# Patient Record
Sex: Female | Born: 1970 | Race: White | Hispanic: No | Marital: Married | Smoking: Former smoker
Health system: Southern US, Academic
[De-identification: ages and names within clinical notes are randomized; demographics above are authoritative.]

## PROBLEM LIST (undated history)

## (undated) ENCOUNTER — Emergency Department (HOSPITAL_COMMUNITY): Admission: EM

## (undated) DIAGNOSIS — I82409 Acute embolism and thrombosis of unspecified deep veins of unspecified lower extremity: Secondary | ICD-10-CM

## (undated) DIAGNOSIS — F32A Depression, unspecified: Secondary | ICD-10-CM

## (undated) HISTORY — PX: HX TUBAL LIGATION: SHX77

## (undated) HISTORY — PX: HX TONSILLECTOMY: SHX27

---

## 2020-03-15 IMAGING — MR MRI LUMBAR SPINE WITHOUT CONTRAST
6 series · 43 of 48 positions shown · IV contrast (gadolinium)
Comparison: None available.

EXAM:  MRI LUMBAR SPINE WITHOUT CONTRAST
INDICATION: Lower back pain with left lower extremity radiculopathy.
TECHNIQUE: Multiplanar multisequential MRI of the lumbar spine was performed without gadolinium contrast.

[Series 10: T2 · sagittal · 4.0mm · 0.94mm/px · 5 of 13 slices shown (1 of 3)]
[im 1/13]
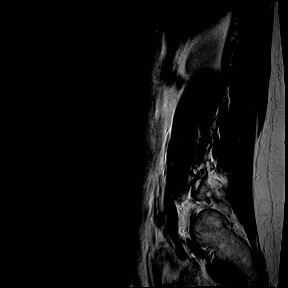
[im 4/13]
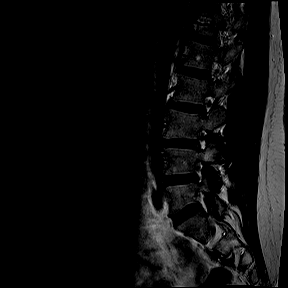
[im 7/13]
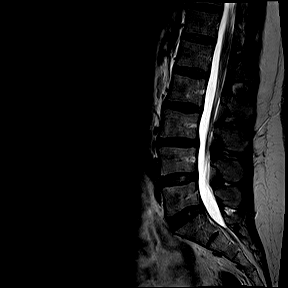
[im 10/13]
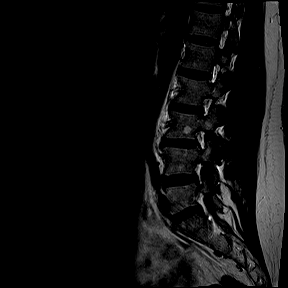
[im 13/13]
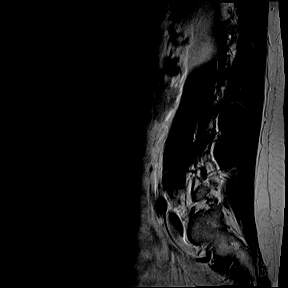

[Series 11: T1 · sagittal · 4.0mm · 0.94mm/px · 6 of 13 slices shown (1 of 2)]
[im 1/13]
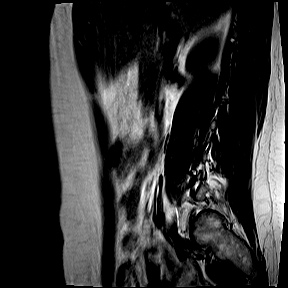
[im 3/13]
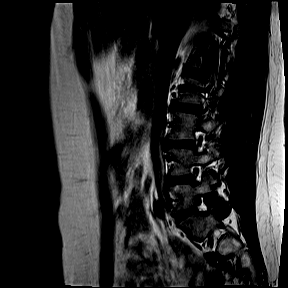
[im 5/13]
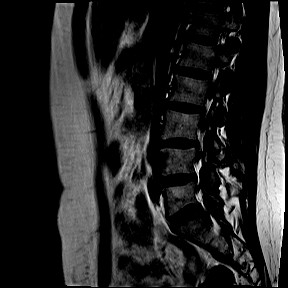
[im 8/13]
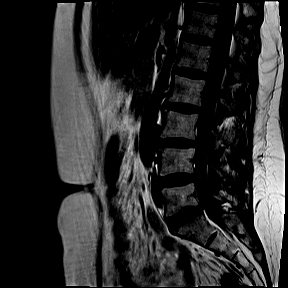
[im 10/13]
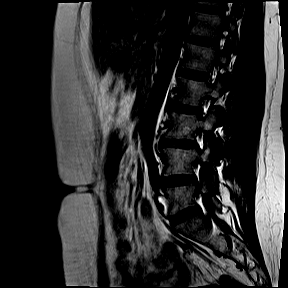
[im 13/13]
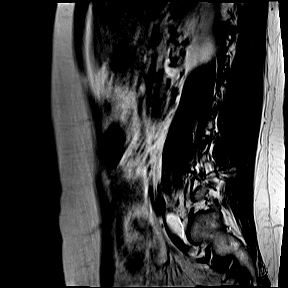

[Series 12: STIR · sagittal · 4.0mm · 1.05mm/px · 4 of 13 slices shown]
[im 1/13]
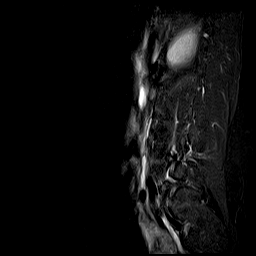
[im 3/13]
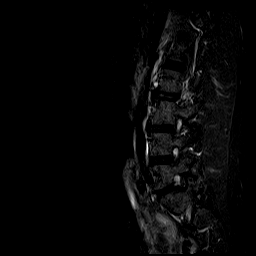
[im 5/13]
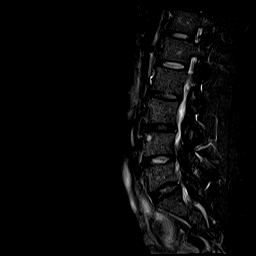
[im 8/13]
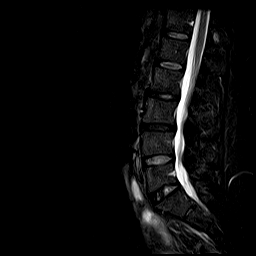

[Series 13: T2 · coronal · 4.0mm · 0.90mm/px · 9 of 20 slices shown (2 of 3)]
[im 1/20]
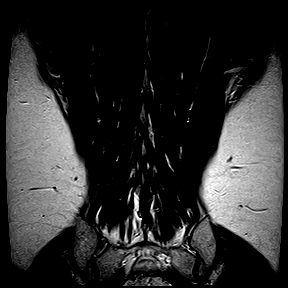
[im 3/20]
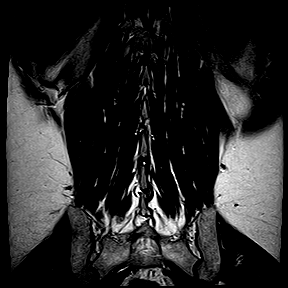
[im 5/20]
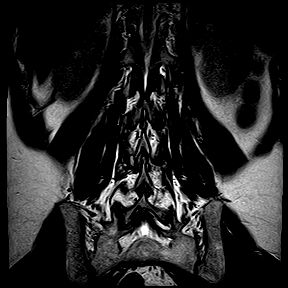
[im 8/20]
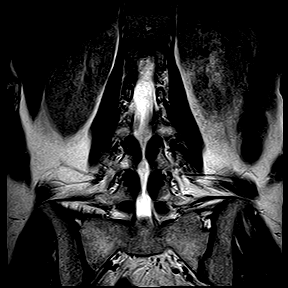
[im 10/20]
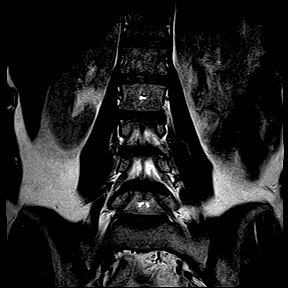
[im 12/20]
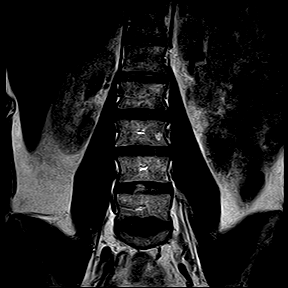
[im 15/20]
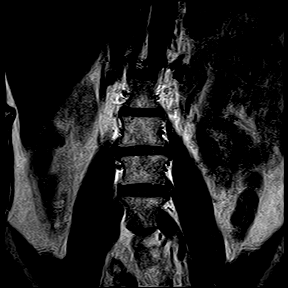
[im 17/20]
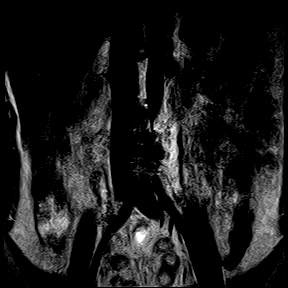
[im 20/20]
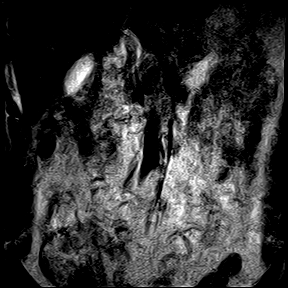

[Series 14: T2 · axial · 4.0mm · 0.47mm/px · z∈[-100,+86]mm · 11 of 23 slices shown (3 of 3)]
[im 1/23]
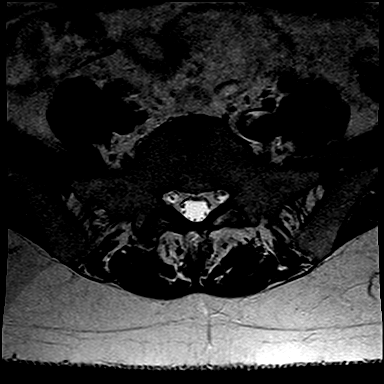
[im 3/23]
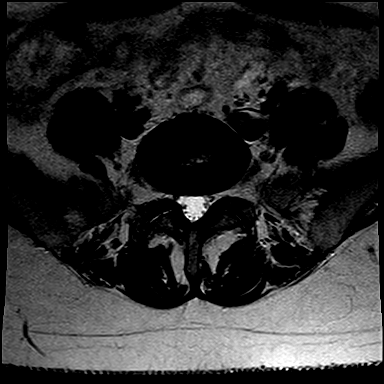
[im 5/23]
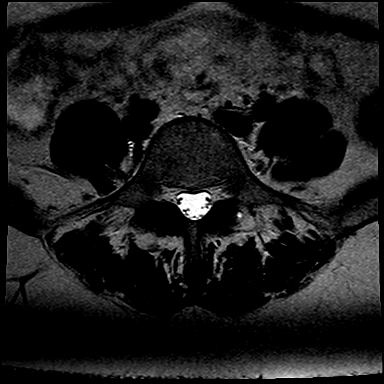
[im 7/23]
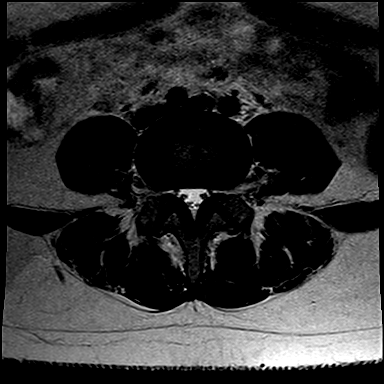
[im 9/23]
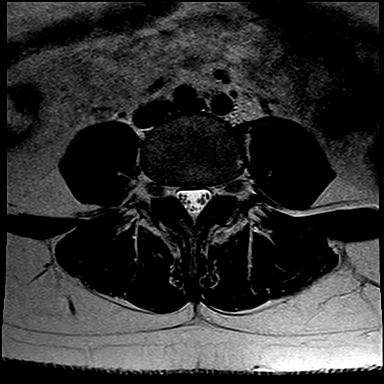
[im 12/23]
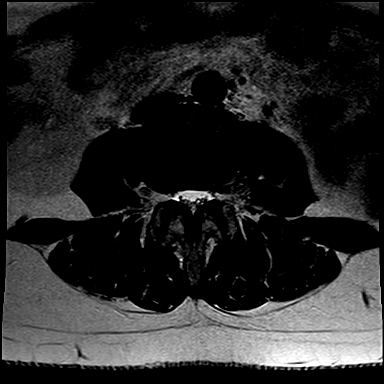
[im 14/23]
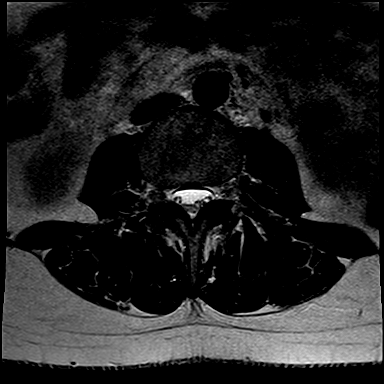
[im 16/23]
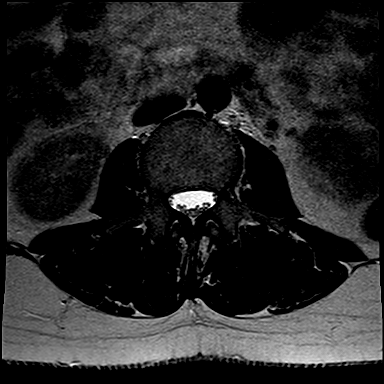
[im 18/23]
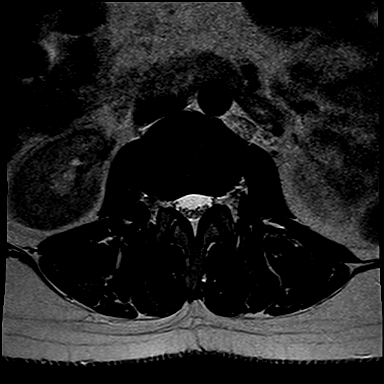
[im 20/23]
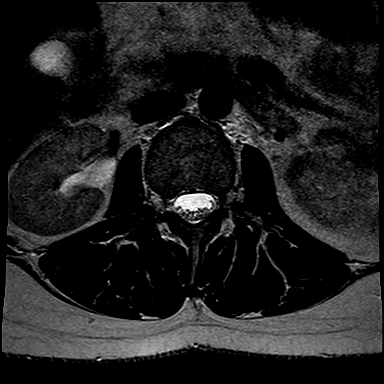
[im 23/23]
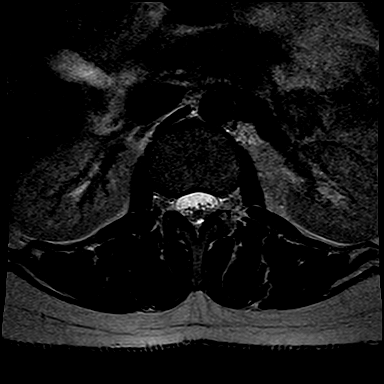

[Series 17: T1 · axial · 4.0mm · 0.47mm/px · z∈[-100,+86]mm · 8 of 23 slices shown (2 of 2)]
[im 1/23]
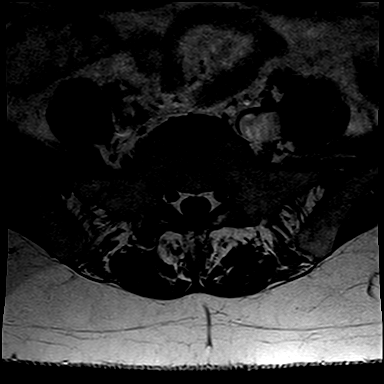
[im 5/23]
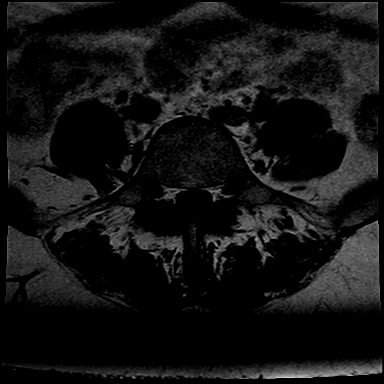
[im 7/23]
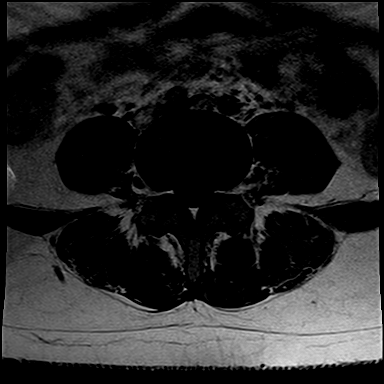
[im 9/23]
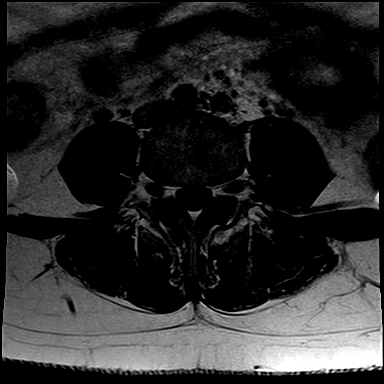
[im 14/23]
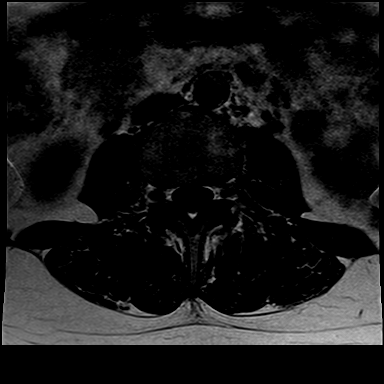
[im 16/23]
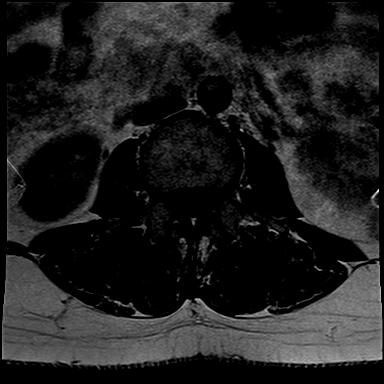
[im 18/23]
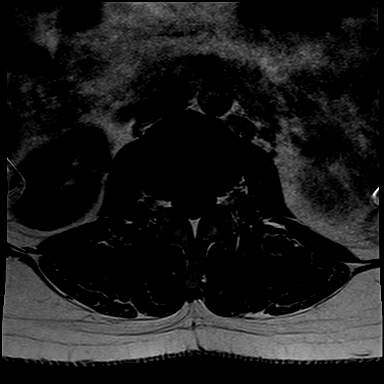
[im 23/23]
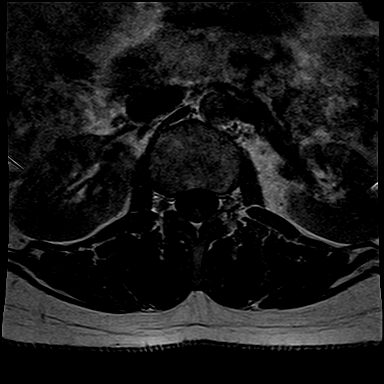

[43 of 48 positions shown; findings below may reference images not displayed]

FINDINGS: Bone marrow signal intensity is normal. There is no acute fracture or subluxation. Distal spinal cord is normal in signal intensity and terminates normally at L1 vertebral body level. Spinal canal is congenitally narrow.

L1-2 level is unremarkable.

At L2-3 level, there is mild-to-moderate right and mild left neural foraminal stenosis from facet arthropathy and bulging annulus.

At L3-4 level, there is minimal anterolisthesis of L3 on L4 vertebral body. There is a minimal bulging annulus, minimally effacing the ventral thecal sac. There is mild left and moderate right neural foraminal stenosis from facet arthropathy and bulging annulus.

At L4-5 level, there is mild-to-moderate right and mild left neural foraminal stenosis from facet arthropathy and bulging annulus.

L5-S1 level and paraspinal soft tissues are unremarkable.
IMPRESSION: Minimal anterolisthesis of L3 on L4 vertebral body. 

No significant disc herniation or spinal stenosis at any level. 

Multilevel neural foraminal stenosis as detailed above.

## 2023-02-23 ENCOUNTER — Emergency Department (HOSPITAL_BASED_OUTPATIENT_CLINIC_OR_DEPARTMENT_OTHER)

## 2023-02-23 ENCOUNTER — Other Ambulatory Visit: Payer: Self-pay

## 2023-02-23 ENCOUNTER — Encounter (HOSPITAL_BASED_OUTPATIENT_CLINIC_OR_DEPARTMENT_OTHER): Payer: Self-pay

## 2023-02-23 ENCOUNTER — Emergency Department
Admission: EM | Admit: 2023-02-23 | Discharge: 2023-02-23 | Disposition: A | Discharge status: Home / Self Care | Attending: Emergency Medicine | Admitting: Emergency Medicine

## 2023-02-23 DIAGNOSIS — R9431 Abnormal electrocardiogram [ECG] [EKG]: Secondary | ICD-10-CM | POA: Insufficient documentation

## 2023-02-23 DIAGNOSIS — W1839XA Other fall on same level, initial encounter: Secondary | ICD-10-CM | POA: Insufficient documentation

## 2023-02-23 DIAGNOSIS — R55 Syncope and collapse: Secondary | ICD-10-CM | POA: Insufficient documentation

## 2023-02-23 DIAGNOSIS — W1830XA Fall on same level, unspecified, initial encounter: Secondary | ICD-10-CM

## 2023-02-23 DIAGNOSIS — S060XAA Concussion with loss of consciousness status unknown, initial encounter: Secondary | ICD-10-CM | POA: Insufficient documentation

## 2023-02-23 DIAGNOSIS — M47812 Spondylosis without myelopathy or radiculopathy, cervical region: Secondary | ICD-10-CM | POA: Insufficient documentation

## 2023-02-23 LAB — COMPREHENSIVE METABOLIC PANEL, NON-FASTING
ALBUMIN/GLOBULIN RATIO: 1 (ref 0.8–1.4)
ALBUMIN: 3.6 g/dL (ref 3.4–5.0)
ALKALINE PHOSPHATASE: 108 U/L (ref 46–116)
ALT (SGPT): 32 U/L (ref <=78)
ANION GAP: 9 mmol/L (ref 4–13)
AST (SGOT): 17 U/L (ref 15–37)
BILIRUBIN TOTAL: 0.4 mg/dL (ref 0.2–1.0)
BUN/CREA RATIO: 13
BUN: 11 mg/dL (ref 7–18)
CALCIUM, CORRECTED: 9 mg/dL
CALCIUM: 8.7 mg/dL (ref 8.5–10.1)
CHLORIDE: 106 mmol/L (ref 98–107)
CO2 TOTAL: 25 mmol/L (ref 21–32)
CREATININE: 0.82 mg/dL (ref 0.55–1.02)
ESTIMATED GFR: 85 mL/min/{1.73_m2} (ref >59)
GLOBULIN: 3.6
GLUCOSE: 122 mg/dL — ABNORMAL HIGH (ref 74–106)
OSMOLALITY, CALCULATED: 280 mOsm/kg (ref 270–290)
POTASSIUM: 3.4 mmol/L — ABNORMAL LOW (ref 3.5–5.1)
PROTEIN TOTAL: 7.2 g/dL (ref 6.4–8.2)
SODIUM: 140 mmol/L (ref 136–145)

## 2023-02-23 LAB — CBC WITH DIFF
BASOPHIL #: 0.02 10*3/uL (ref 0.00–0.30)
BASOPHIL %: 0 % (ref 0–3)
EOSINOPHIL #: 0.12 10*3/uL (ref 0.00–0.80)
EOSINOPHIL %: 1 % (ref 0–7)
HCT: 40.6 % (ref 37.0–47.0)
HGB: 13.7 g/dL (ref 12.5–16.0)
LYMPHOCYTE #: 2.69 10*3/uL (ref 1.10–5.00)
LYMPHOCYTE %: 32 % (ref 25–45)
MCH: 28.9 pg (ref 27.0–32.0)
MCHC: 33.8 g/dL (ref 32.0–36.0)
MCV: 85.5 fL (ref 78.0–99.0)
MONOCYTE #: 0.41 10*3/uL (ref 0.00–1.30)
MONOCYTE %: 5 % (ref 0–12)
MPV: 7 fL — ABNORMAL LOW (ref 7.4–10.4)
NEUTROPHIL #: 5.07 10*3/uL (ref 1.80–8.40)
NEUTROPHIL %: 61 % (ref 40–76)
PLATELETS: 301 10*3/uL (ref 140–440)
RBC: 4.75 10*6/uL (ref 4.20–5.40)
RDW: 14.6 % (ref 11.6–14.8)
WBC: 8.3 10*3/uL (ref 4.0–10.5)

## 2023-02-23 LAB — TROPONIN-I
TROPONIN I: 3 ng/L (ref <15)
TROPONIN I: 3 ng/L (ref <15)

## 2023-02-23 LAB — BLUE TOP TUBE

## 2023-02-23 LAB — GOLD TOP TUBE

## 2023-02-23 LAB — GRAY TOP TUBE

## 2023-02-23 MED ORDER — HYDROCODONE 5 MG-ACETAMINOPHEN 325 MG TABLET
1.0000 | ORAL_TABLET | ORAL | Status: AC
Start: 2023-02-23 — End: 2023-02-23
  Administered 2023-02-23: 1 via ORAL

## 2023-02-23 MED ORDER — ASPIRIN 81 MG CHEWABLE TABLET
324.0000 mg | CHEWABLE_TABLET | ORAL | Status: AC
Start: 2023-02-23 — End: 2023-02-23
  Administered 2023-02-23: 324 mg via ORAL

## 2023-02-23 MED ORDER — LIDOCAINE 4 % TOPICAL PATCH
1.0000 | MEDICATED_PATCH | CUTANEOUS | Status: DC
Start: 2023-02-23 — End: 2023-02-23
  Administered 2023-02-23: 1 via TRANSDERMAL

## 2023-02-23 MED ORDER — LIDOCAINE 4 % TOPICAL PATCH
MEDICATED_PATCH | CUTANEOUS | Status: AC
Start: 2023-02-23 — End: 2023-02-23
  Filled 2023-02-23: qty 1

## 2023-02-23 MED ORDER — HYDROCODONE 5 MG-ACETAMINOPHEN 325 MG TABLET
ORAL_TABLET | ORAL | Status: AC
Start: 2023-02-23 — End: 2023-02-23
  Filled 2023-02-23: qty 1

## 2023-02-23 MED ORDER — ASPIRIN 81 MG CHEWABLE TABLET
CHEWABLE_TABLET | ORAL | Status: AC
Start: 2023-02-23 — End: 2023-02-23
  Filled 2023-02-23: qty 4

## 2023-02-23 NOTE — ED Nurses Note (Signed)
Patient discharged home with family.  AVS reviewed with patient/care giver.  A written copy of the AVS and discharge instructions was given to the patient/care giver.  Questions sufficiently answered as needed.  Patient/care giver encouraged to follow up with PCP as indicated.  In the event of an emergency, patient/care giver instructed to call 911 or go to the nearest emergency room.

## 2023-02-23 NOTE — ED Nurses Note (Signed)
Orthostatic VS  Right Arm    Lying BP 114/74 HR 75  Standing BP 116/80 HR 67

## 2023-02-23 NOTE — Discharge Instructions (Addendum)
Your evaluation did not reveal critical condition requiring hospitalization.  I would like you to increase your hydration.  You likely did sustain a concussion.  It is important that you avoid a 2nd head injury.  Cut down on your screen time.  Back down on activities as needed.  Follow with primary care ideally within the next 2-3 days.  You may benefit from an evaluation from Cardiology which can be arranged through primary care.  Return to emergency department if you have multiple episodes of passing out chest pain shortness of breath palpitations or any other concerning symptoms.  Thank you for visiting [facility]

## 2023-02-23 NOTE — ED Triage Notes (Signed)
EMS reports called around 0043 for pt falling off of toilet and hitting her head on metal trashcan . Pt reports stood up from toilet and then doesn't remember anything . Pt on back board and C spine immobilized on arrival to ED

## 2023-02-23 NOTE — ED Provider Notes (Signed)
[facility], [facility] Emergency Department  ED Primary Provider Note  HPI:  Terri Bautista is a 52 y.o. female     Patient states that she felt dizzy upon standing to go the bathroom.  She fell backwards and hit her head.  She did pass out.  Complains of pain in the back of her head neck and upper back.  She is mildly nauseous was not vomited.  Denies abdominal pain chest pain shortness of breath palpitations.  She denies any significant past medical history.  No history of acute coronary syndrome VTE.  COVID vaccinated.  Full code.  ROS review and negative aside from stated in HPI.    Physical Exam:  ED Triage Vitals [02/23/23 0144]   BP (Non-Invasive) 123/80   Heart Rate 67   Respiratory Rate 18   Temperature 36.7 C (98.1 F)   SpO2 97 %   Weight 74.8 kg (165 lb)   Height 1.727 m (5\' 8" )     No acute distress.  Patient awake alert oriented x3.  Mood is appropriate.  GCS 15 head is atraumatic.  No cephalohematoma or hemotympanum.  Pupils 3 mm equal round reactive.  Extraocular movements are intact.  Oropharynx is clear.  Mucous membranes moist.  Trachea midline.  Neck is supple.  She does have midline neck tenderness.  Tender upper thoracic back as well.  No bony step-offs.  Heart has regular rate and rhythm without significant murmurs rubs or gallops.  Lungs are clear to auscultation.  Abdomen soft nontender, nondistended.  Moving all extremities without difficulty.  No rash no edema.      Patient data:  Labs Ordered/Reviewed   COMPREHENSIVE METABOLIC PANEL, NON-FASTING - Abnormal; Notable for the following components:       Result Value    POTASSIUM 3.4 (*)     GLUCOSE 122 (*)     All other components within normal limits    Narrative:     Estimated Glomerular Filtration Rate (eGFR) is calculated using the CKD-EPI (2021) equation, intended for patients 31 years of age and older. If gender is not documented or "unknown", there will be no eGFR calculation.   CBC WITH DIFF - Abnormal;  Notable for the following components:    MPV 7.0 (*)     All other components within normal limits   TROPONIN-I - Normal    Narrative:     Values received on females ranging between 12-15 ng/L MUST include the next serial troponin to review changes in the delta differences as the reference range for the Access II chemistry analyzer is lower than the established reference range.     TROPONIN-I - Normal    Narrative:     Values received on females ranging between 12-15 ng/L MUST include the next serial troponin to review changes in the delta differences as the reference range for the Access II chemistry analyzer is lower than the established reference range.     CBC/DIFF    Narrative:     The following orders were created for panel order CBC/DIFF.  Procedure                               Abnormality         Status                     ---------                               -----------         ------  CBC WITH DIFF[[id number]]                Abnormal            Final result                 Please view results for these tests on the individual orders.   BLUE TOP TUBE   GRAY TOP TUBE     No orders to display     EKG read by me shows a sinus rhythm with a rate of 70, normal axis, QTC is 460.  I do not appreciate significant ST or T-wave changes no delta waves are S1 Q3 T3 pattern    MDM:  Syncopal episode.  Broad DX includes arrhythmia acute coronary syndrome ICH skull fracture cervical spine injury.  Vitals here within normal limits.  GCS 15.  CT imaging of the head and neck did not reveal acute abnormality.  Likewise x-rays of the thoracic spine are unremarkable.  EKG nonspecific.  Screening labs CBC CMP troponin all grossly within normal limits.  She was not orthostatic.  I did review these findings with the patient and answered her questions.  She did receive a dose of Norco and lidocaine patchf or musculoskeletal pain.  She stated she felt better on re-evaluation.  She was ambulatory not dizzy upon  standing.  I estimate low probability of critical pathology at this time.  Referred to primary care .  I gave strict return to ED instructions in both a verbal and written manner.  She is  comfortable with discharge      Discharged  Clinical Impression   Syncope, unspecified syncope type (Primary)   Closed head injury with concussion     Medications Administered in the ED   aspirin chewable tablet 324 mg (324 mg Oral Given 02/23/23 0320)   HYDROcodone-acetaminophen (NORCO) 5-325 mg per tablet (1 Tablet Oral Given 02/23/23 0455)        Current Discharge Medication List      You have not been prescribed any medications.

## 2023-02-25 DIAGNOSIS — R079 Chest pain, unspecified: Secondary | ICD-10-CM

## 2023-02-25 DIAGNOSIS — R9431 Abnormal electrocardiogram [ECG] [EKG]: Secondary | ICD-10-CM

## 2023-02-25 LAB — ECG 12 LEAD
Atrial Rate: 70 {beats}/min
Calculated P Axis: 46 degrees
Calculated R Axis: 60 degrees
Calculated T Axis: 49 degrees
PR Interval: 166 ms
QRS Duration: 84 ms
QT Interval: 426 ms
QTC Calculation: 460 ms
Ventricular rate: 70 {beats}/min

## 2023-06-22 ENCOUNTER — Other Ambulatory Visit: Payer: Self-pay

## 2023-06-22 ENCOUNTER — Emergency Department (HOSPITAL_BASED_OUTPATIENT_CLINIC_OR_DEPARTMENT_OTHER)

## 2023-06-22 ENCOUNTER — Encounter (HOSPITAL_BASED_OUTPATIENT_CLINIC_OR_DEPARTMENT_OTHER): Payer: Self-pay

## 2023-06-22 ENCOUNTER — Emergency Department
Admission: EM | Admit: 2023-06-22 | Discharge: 2023-06-22 | Disposition: A | Discharge status: Home / Self Care | Attending: Family | Admitting: Family

## 2023-06-22 DIAGNOSIS — M5432 Sciatica, left side: Secondary | ICD-10-CM | POA: Insufficient documentation

## 2023-06-22 DIAGNOSIS — M5116 Intervertebral disc disorders with radiculopathy, lumbar region: Secondary | ICD-10-CM | POA: Insufficient documentation

## 2023-06-22 DIAGNOSIS — M5442 Lumbago with sciatica, left side: Secondary | ICD-10-CM

## 2023-06-22 DIAGNOSIS — M48061 Spinal stenosis, lumbar region without neurogenic claudication: Secondary | ICD-10-CM | POA: Insufficient documentation

## 2023-06-22 DIAGNOSIS — M5412 Radiculopathy, cervical region: Secondary | ICD-10-CM

## 2023-06-22 DIAGNOSIS — M5416 Radiculopathy, lumbar region: Secondary | ICD-10-CM

## 2023-06-22 DIAGNOSIS — M5010 Cervical disc disorder with radiculopathy, unspecified cervical region: Secondary | ICD-10-CM | POA: Insufficient documentation

## 2023-06-22 HISTORY — DX: Depression, unspecified: F32.A

## 2023-06-22 MED ORDER — METHYLPREDNISOLONE 4 MG TABLETS IN A DOSE PACK
ORAL_TABLET | ORAL | 0 refills | Status: DC
Start: 2023-06-22 — End: 2023-07-02

## 2023-06-22 MED ORDER — KETOROLAC 60 MG/2 ML INTRAMUSCULAR SOLUTION
INTRAMUSCULAR | Status: AC
Start: 2023-06-22 — End: 2023-06-22
  Filled 2023-06-22: qty 2

## 2023-06-22 MED ORDER — TRAMADOL 50 MG TABLET
100.0000 mg | ORAL_TABLET | ORAL | Status: AC
Start: 2023-06-22 — End: 2023-06-22
  Administered 2023-06-22: 100 mg via ORAL

## 2023-06-22 MED ORDER — METHYLPREDNISOLONE SOD SUCC 125 MG SOLUTION FOR INJECTION WRAPPER
125.0000 mg | INTRAVENOUS | Status: AC
Start: 2023-06-22 — End: 2023-06-22
  Administered 2023-06-22: 125 mg via INTRAMUSCULAR

## 2023-06-22 MED ORDER — TRAMADOL 50 MG TABLET
ORAL_TABLET | ORAL | Status: AC
Start: 2023-06-22 — End: 2023-06-22
  Filled 2023-06-22: qty 2

## 2023-06-22 MED ORDER — PANTOPRAZOLE 40 MG TABLET,DELAYED RELEASE
40.0000 mg | DELAYED_RELEASE_TABLET | Freq: Every day | ORAL | 0 refills | Status: AC
Start: 2023-06-22 — End: 2023-07-06

## 2023-06-22 MED ORDER — MELOXICAM 15 MG TABLET
15.0000 mg | ORAL_TABLET | Freq: Every day | ORAL | 0 refills | Status: AC
Start: 2023-06-22 — End: 2023-07-02

## 2023-06-22 MED ORDER — METHOCARBAMOL 750 MG TABLET
ORAL_TABLET | ORAL | Status: AC
Start: 2023-06-22 — End: 2023-06-22
  Filled 2023-06-22: qty 2

## 2023-06-22 MED ORDER — METHOCARBAMOL 750 MG TABLET
1500.0000 mg | ORAL_TABLET | Freq: Once | ORAL | Status: AC
Start: 2023-06-22 — End: 2023-06-22
  Administered 2023-06-22: 1500 mg via ORAL

## 2023-06-22 MED ORDER — KETOROLAC 60 MG/2 ML INTRAMUSCULAR SOLUTION
60.0000 mg | INTRAMUSCULAR | Status: AC
Start: 2023-06-22 — End: 2023-06-22
  Administered 2023-06-22: 60 mg via INTRAMUSCULAR

## 2023-06-22 MED ORDER — METHYLPREDNISOLONE SOD SUCC 125 MG SOLUTION FOR INJECTION WRAPPER
INTRAVENOUS | Status: AC
Start: 2023-06-22 — End: 2023-06-22
  Filled 2023-06-22: qty 2

## 2023-06-22 MED ORDER — TIZANIDINE 4 MG TABLET
4.0000 mg | ORAL_TABLET | Freq: Three times a day (TID) | ORAL | 0 refills | Status: DC
Start: 2023-06-22 — End: 2023-07-02

## 2023-06-22 NOTE — ED Nurses Note (Signed)
Bilateral calf circumference is 13 in.

## 2023-06-22 NOTE — ED Nurses Note (Signed)
Pt reports "significant " relief in pain in arm, leg and neck. Steady gait noted. Family at bedside

## 2023-06-22 NOTE — ED Nurses Note (Signed)
Pt reports posterior neck pain with left arm and left leg radiation. Reports tingling, cramping and sharp shooting in those areas. Ongoing intermittently x1 month. Scheduled for MRI Monday by [facility] provider.

## 2023-06-22 NOTE — Discharge Instructions (Signed)
Keep appt for mri. May need physical therapy. Return for any concerns such as swelling of legs arms, chest pain short of breath. Diff controlling urine or bowels. Any concerns. Alternate ice and heat.

## 2023-06-22 NOTE — ED Nurses Note (Signed)
Patient discharged home with family.  AVS reviewed with patient/care giver.  A written copy of the AVS and discharge instructions was given to the patient/care giver. Scripts called to pharmacy of choice. Questions sufficiently answered as needed.  Patient/care giver encouraged to follow up with PCP as indicated.  In the event of an emergency, patient/care giver instructed to call 911 or go to the nearest emergency room.

## 2023-06-22 NOTE — ED Provider Notes (Signed)
[facility], [facility] Emergency Department  ED Primary Provider Note  History of Present Illness   Chief Complaint   Patient presents with    Neck Pain     Arrival: The patient arrived by Car  Terri Bautista is a 52 y.o. female who had concerns including Neck Pain.  Pt states severe pain in neck and left back. Radiates to left lower leg and left shoulder . Denies diff controlling bowel urine denies cp nor sob, denise hx of clots.   Review of Systems   Constitutional: No fever, chills or weakness   Skin: No rash or diaphoresis  HENT: No headaches, or congestion  Eyes: No vision changes or photophobia   Cardio: No chest pain, palpitations or leg swelling   Respiratory: No cough, wheezing or SOB  GI:  No nausea, vomiting or stool changes  GU:  No dysuria, hematuria, or increased frequency  MSK: +muscle aches, joint + neck, back pain  Neuro: No seizures, LOC, numbness, tingling, or focal weakness  Psychiatric: No depression, SI or substance abuse  All other systems reviewed and are negative.    History Reviewed This Encounter: all noted and reviewed.     Physical Exam   ED Triage Vitals [06/22/23 1851]   BP (Non-Invasive) (!) 147/86   Heart Rate (!) 114   Respiratory Rate 18   Temperature 37.6 C (99.7 F)   SpO2 95 %   Weight 72.6 kg (160 lb)   Height 1.651 m (5\' 5" )     Constitutional:  51 y.o. female who appears in no distress. Normal color, no cyanosis.   HENT:   Head: Normocephalic and atraumatic.   Mouth/Throat: Oropharynx is clear and moist.   Eyes: EOMI, PERRL   Neck: Trachea midline. Neck supple.+ left paraspinal tenderness.   Cardiovascular: RRR, No murmurs, rubs or gallops. Intact distal pulses.  Pulmonary/Chest: BS equal bilaterally. No respiratory distress. No wheezes, rales or chest tenderness.   Abdominal: Bowel sounds present and normal. Abdomen soft, no tenderness, no rebound and no guarding.  Back: No midline spinal tenderness, + left  paraspinal tenderness, no CVA  tenderness.           Musculoskeletal: No edema, tenderness or deformity. Neg calf exam  Skin: warm and dry. No rash, erythema, pallor or cyanosis  Psychiatric: normal mood and affect. Behavior is normal.   Neurological: Patient keenly alert and responsive, easily able to raise eyebrows, facial muscles/expressions symmetric, speaking in fluent sentences, moving all extremities equally and fully, normal gait  Patient Data   Labs Ordered/Reviewed - No data to display  CT LUMBAR SPINE WO IV CONTRAST   Final Result by Edi, Radresults In (09/10 1947)   1. No acute fracture or subluxation is identified   2. Degenerative disc disease resulting in some stenosis at the L3-4 and L4-5 levels. There is no obvious left nerve root compression to account for the patient's         One or more dose reduction techniques were used (e.g., Automated exposure control, adjustment of the mA and/or kV according to patient size, use of iterative reconstruction technique).         Radiologist location ID: [id number]         CT CERVICAL SPINE WO IV CONTRAST   Final Result by Edi, Radresults In (09/10 1947)   Moderately advanced cervical degenerative disease         One or more dose reduction techniques were used (e.g., Automated exposure control,  adjustment of the mA and/or kV according to patient size, use of iterative reconstruction technique).         Radiologist location ID: [id number]           Medical Decision Making   Diff dx of cervical radiculopathy , sciatica. Pvd clots. Ddd. Ct scan of c and LS spine show advanced ddd. Pt had some pain relief             Medications Administered in the ED   ketorolac (TORADOL) 60mg /2 mL IM injection (60 mg IntraMUSCULAR Given 06/22/23 1933)   methocarbamol (ROBAXIN) tablet (1,500 mg Oral Given 06/22/23 1930)   methylPREDNISolone sod succ (SOLU-medrol) 125 mg/2 mL injection (125 mg IntraMUSCULAR Given 06/22/23 1931)   traMADol (ULTRAM) tablet (100 mg Oral Given 06/22/23 1929)     Clinical Impression    Cervical radiculopathy (Primary)   Lumbar radiculopathy   Sciatica of left side       Disposition: Discharged

## 2023-06-22 NOTE — ED Triage Notes (Signed)
Patient states started about a month ago and has been to the doctor and is scheduled for an MRI to see if it is degenerative disc disease. Patient states she has neck and back pain that goes down bilateral arms but worse on the left. Pain going all the way down left leg. Right leg is ok.

## 2023-06-27 ENCOUNTER — Emergency Department
Admission: EM | Admit: 2023-06-27 | Discharge: 2023-06-27 | Disposition: A | Discharge status: Home / Self Care | Attending: Emergency Medicine | Admitting: Emergency Medicine

## 2023-06-27 ENCOUNTER — Other Ambulatory Visit: Payer: Self-pay

## 2023-06-27 ENCOUNTER — Encounter (HOSPITAL_BASED_OUTPATIENT_CLINIC_OR_DEPARTMENT_OTHER): Payer: Self-pay

## 2023-06-27 ENCOUNTER — Emergency Department (HOSPITAL_COMMUNITY)

## 2023-06-27 DIAGNOSIS — R600 Localized edema: Secondary | ICD-10-CM

## 2023-06-27 DIAGNOSIS — M79606 Pain in leg, unspecified: Secondary | ICD-10-CM

## 2023-06-27 DIAGNOSIS — M79605 Pain in left leg: Secondary | ICD-10-CM

## 2023-06-27 DIAGNOSIS — I82452 Acute embolism and thrombosis of left peroneal vein: Secondary | ICD-10-CM | POA: Insufficient documentation

## 2023-06-27 DIAGNOSIS — M25552 Pain in left hip: Secondary | ICD-10-CM

## 2023-06-27 DIAGNOSIS — M5136 Other intervertebral disc degeneration, lumbar region: Secondary | ICD-10-CM | POA: Insufficient documentation

## 2023-06-27 DIAGNOSIS — M5030 Other cervical disc degeneration, unspecified cervical region: Secondary | ICD-10-CM | POA: Insufficient documentation

## 2023-06-27 DIAGNOSIS — I82432 Acute embolism and thrombosis of left popliteal vein: Secondary | ICD-10-CM | POA: Insufficient documentation

## 2023-06-27 DIAGNOSIS — I824Y2 Acute embolism and thrombosis of unspecified deep veins of left proximal lower extremity: Secondary | ICD-10-CM | POA: Insufficient documentation

## 2023-06-27 DIAGNOSIS — I82412 Acute embolism and thrombosis of left femoral vein: Secondary | ICD-10-CM | POA: Insufficient documentation

## 2023-06-27 LAB — CBC WITH DIFF
BASOPHIL #: 0.1 10*3/uL (ref 0.00–0.10)
BASOPHIL %: 1 % (ref 0–1)
EOSINOPHIL #: 0 10*3/uL (ref 0.00–0.50)
EOSINOPHIL %: 0 % — ABNORMAL LOW (ref 1–7)
HCT: 40.6 % (ref 31.2–41.9)
HGB: 13.5 g/dL (ref 10.9–14.3)
LYMPHOCYTE #: 2.1 10*3/uL (ref 1.00–3.00)
LYMPHOCYTE %: 13 % — ABNORMAL LOW (ref 16–44)
MCH: 28.4 pg (ref 24.7–32.8)
MCHC: 33.4 g/dL (ref 32.3–35.6)
MCV: 85 fL (ref 75.5–95.3)
MONOCYTE #: 1.1 10*3/uL — ABNORMAL HIGH (ref 0.30–1.00)
MONOCYTE %: 7 % (ref 5–13)
MPV: 6.9 fL — ABNORMAL LOW (ref 7.9–10.8)
NEUTROPHIL #: 12.6 10*3/uL — ABNORMAL HIGH (ref 1.85–7.80)
NEUTROPHIL %: 79 % — ABNORMAL HIGH (ref 43–77)
PLATELETS: 309 10*3/uL (ref 140–440)
RBC: 4.77 10*6/uL (ref 3.63–4.92)
RDW: 13.2 % (ref 12.3–17.7)
WBC: 15.9 10*3/uL — ABNORMAL HIGH (ref 3.8–11.8)

## 2023-06-27 LAB — COMPREHENSIVE METABOLIC PANEL, NON-FASTING
ALBUMIN/GLOBULIN RATIO: 1.4 (ref 0.8–1.4)
ALBUMIN: 4.7 g/dL (ref 3.5–5.7)
ALKALINE PHOSPHATASE: 100 U/L (ref 34–104)
ALT (SGPT): 38 U/L (ref 7–52)
ANION GAP: 9 mmol/L (ref 4–13)
AST (SGOT): 14 U/L (ref 13–39)
BILIRUBIN TOTAL: 0.7 mg/dL (ref 0.3–1.0)
BUN/CREA RATIO: 21 (ref 6–22)
BUN: 16 mg/dL (ref 7–25)
CALCIUM, CORRECTED: 8.8 mg/dL — ABNORMAL LOW (ref 8.9–10.8)
CALCIUM: 9.4 mg/dL (ref 8.6–10.3)
CHLORIDE: 104 mmol/L (ref 98–107)
CO2 TOTAL: 27 mmol/L (ref 21–31)
CREATININE: 0.77 mg/dL (ref 0.60–1.30)
ESTIMATED GFR: 92 mL/min/{1.73_m2} (ref >59)
GLOBULIN: 3.4 (ref 2.9–5.4)
GLUCOSE: 110 mg/dL — ABNORMAL HIGH (ref 74–109)
OSMOLALITY, CALCULATED: 281 mOsm/kg (ref 270–290)
POTASSIUM: 4.4 mmol/L (ref 3.5–5.1)
PROTEIN TOTAL: 8.1 g/dL (ref 6.4–8.9)
SODIUM: 140 mmol/L (ref 136–145)

## 2023-06-27 LAB — PTT (PARTIAL THROMBOPLASTIN TIME): APTT: 29.2 seconds (ref 25.0–38.0)

## 2023-06-27 LAB — PT/INR
INR: 1.04 (ref 0.84–1.10)
PROTHROMBIN TIME: 12.2 seconds (ref 9.8–12.7)

## 2023-06-27 MED ORDER — MORPHINE 4 MG/ML INJECTION WRAPPER
INJECTION | INTRAMUSCULAR | Status: AC
Start: 2023-06-27 — End: 2023-06-27
  Filled 2023-06-27: qty 1

## 2023-06-27 MED ORDER — MORPHINE 2 MG/ML INJECTION WRAPPER
INJECTION | INTRAMUSCULAR | Status: AC
Start: 2023-06-27 — End: 2023-06-27
  Filled 2023-06-27: qty 1

## 2023-06-27 MED ORDER — APIXABAN 5 MG TABLET
ORAL_TABLET | ORAL | 0 refills | Status: DC
Start: 2023-06-27 — End: 2023-07-02

## 2023-06-27 MED ORDER — ONDANSETRON 4 MG DISINTEGRATING TABLET
4.0000 mg | ORAL_TABLET | ORAL | Status: AC
Start: 2023-06-27 — End: 2023-06-27
  Administered 2023-06-27: 4 mg via ORAL

## 2023-06-27 MED ORDER — MORPHINE 2 MG/ML INJECTION WRAPPER
2.0000 mg | INJECTION | INTRAMUSCULAR | Status: AC
Start: 2023-06-27 — End: 2023-06-27
  Administered 2023-06-27: 2 mg via INTRAMUSCULAR

## 2023-06-27 MED ORDER — ONDANSETRON 4 MG DISINTEGRATING TABLET
ORAL_TABLET | ORAL | Status: AC
Start: 2023-06-27 — End: 2023-06-27
  Filled 2023-06-27: qty 1

## 2023-06-27 MED ORDER — MORPHINE 4 MG/ML INJECTION WRAPPER
4.0000 mg | INJECTION | INTRAMUSCULAR | Status: AC
Start: 2023-06-27 — End: 2023-06-27
  Administered 2023-06-27: 4 mg via INTRAMUSCULAR

## 2023-06-27 MED ORDER — APIXABAN 5 MG TABLET
ORAL_TABLET | ORAL | 0 refills | Status: DC
Start: 2023-06-27 — End: 2023-06-27

## 2023-06-27 NOTE — ED Nurses Note (Signed)
Pt reports has increased pain to left shoulder, back and left thigh this AM. Rates pain 8. Stated pain is so severe it is causing difficulty with ambulation. Reports left thigh swelling. Pt has been seen by ortho for shoulder but was told it is possible cervical in nature and will be scheduled for another ortho. Left thigh with redness and swelling noted. Pt denies SOB, hx of blood clots, loss of bowel or bladder, HA, change of vision. Resp even and unlabored on RA.

## 2023-06-27 NOTE — ED Provider Notes (Signed)
[facility]  ED Primary Provider Note  Patient Name: Terri Bautista  Patient Age: 52 y.o.  Date of Birth: 06-07-1971    Chief Complaint: Hip Pain (left) and Leg Pain (left)        History of Present Illness       Terri Bautista is a 52 y.o. female who had concerns including Hip Pain and Leg Pain.     HPI      chief complaint leg pain.      HPI   Location from the left hip to the left ankle.  Radiation none   Severity 10/10   Onset acute   Timing this morning   Recent medical history: For the past several months the patient has been battling severe degenerative disc disease.  She has quite severe lumbar spine degenerative disc disease diagnosed with CT scan here.  She also has moderate cervical spine degenerative disc disease as diagnosed with CT.  She has an MRI of the cervical spine ordered as an outpatient but it has not been completed yet.  Due to these chronic conditions, she has not had very much movement and mobility.  I do feel that she is at significant risk for deep vein thrombosis.    Mechanism: Patient has decreased activity secondary to degenerative disc disease noted above.  She is at increased risk for deep vein thrombosis.  Progression of pain: Patient states that her left shoulder left arm always hurts.  She states it is worse today than normal.  However her main complaint today is pain so severe that she can not walk in her left leg.  Upon exam she has a very swollen left thigh in comparison to the right as well as a right calf that is significantly swollen.  The right thigh is also red hot and very tender.    Additional symptoms:  The patient has no new chest pain.  She states she does have muscular soreness and pain from her shoulder and neck.  The patient has no shortness of breath no palpitations or tachycardia that would indicate the possibility of thromboembolic disease from the leg.  The patient has no nausea vomiting or diarrhea.  She has no slurred speech no facial  droop.  Her mobility problems as from pain only.  This is not from function/neurologic insult.  Modifying factors: Patient states the pain is so bad she can not walk.  Nothing helps.  She has been taking different medications.  She has currently tapering up on gabapentin.  She is currently on 300 mg once a day.    PMH: reviewed and listed on the chart below.   SH: reviewed and listed on the chart below.   Social History: reviewed and listed on the chart below.   Family History: reviewed and listed on the chart below.       PMHx:    Past Medical History:   Diagnosis Date    Depression     PSHx:   Past Surgical History:   Procedure Laterality Date    HX TONSILLECTOMY      HX TUBAL LIGATION         Allergies:    Allergies   Allergen Reactions    Bactrim [Sulfamethoxazole-Trimethoprim] Rash    Cephalexin     Social History  Social History     Tobacco Use    Smoking status: Former     Types: Cigarettes    Smokeless tobacco: Never   Vaping Use  Vaping status: Never Used   Substance Use Topics    Alcohol use: Not Currently    Drug use: Never       Family History  Family Medical History:    None            Home Meds:      Prior to Admission medications    Medication Sig Start Date End Date Taking? Authorizing Provider   acetaminophen (TYLENOL) 325 mg Oral Tablet Take 1 Tablet (325 mg total) by mouth Every 4 hours as needed for Pain    Provider, Historical   gabapentin (NEURONTIN) 300 mg Oral Capsule Take 1 Capsule (300 mg total) by mouth   Yes Provider, Historical   meloxicam (MOBIC) 15 mg Oral Tablet Take 1 Tablet (15 mg total) by mouth Once a day for 10 days 06/22/23 07/02/23  Judeth Cornfield, FNP-BC   Methylprednisolone (MEDROL DOSEPACK) 4 mg Oral Tablets, Dose Pack Take as instructed. 06/22/23   Judeth Cornfield, FNP-BC   pantoprazole (PROTONIX) 40 mg Oral Tablet, Delayed Release (E.C.) Take 1 Tablet (40 mg total) by mouth Once a day for 14 days Indications: gi protection 06/22/23 07/06/23  Judeth Cornfield, FNP-BC    sertraline (ZOLOFT) 100 mg Oral Tablet Take 1 Tablet (100 mg total) by mouth Once a day    Provider, Historical   tiZANidine (ZANAFLEX) 4 mg Oral Tablet Take 1 Tablet (4 mg total) by mouth Three times a day 06/22/23   Judeth Cornfield, FNP-BC   Ibuprofen (MOTRIN) 600 mg Oral Tablet Take 1 Tablet (600 mg total) by mouth Four times a day as needed for Pain  06/22/23  Provider, Historical   methocarbamoL (ROBAXIN) 500 mg Oral Tablet Take 1 Tablet (500 mg total) by mouth Four times a day  06/22/23  Provider, Historical          ROS: A total of 10 systems were reviewed by me at the time of the visit. All are negative except the HPI and the following.           Physical Exam   ED Triage Vitals [06/27/23 0743]   BP (Non-Invasive) 108/70   Heart Rate 85   Respiratory Rate 20   Temperature 36.7 C (98.1 F)   SpO2 99 %   Weight    Height          Physical exam  Constitutional/general: The nursing notes were reviewed and agreed upon.  The vital signs were reviewed and are listed on the chart.   52 year old female who is in no acute distress.  Patient appears to be in a moderate amount of pain.    HEENT: Eyes show normal extraocular movements.  Well-hydrated oral mucosa is noted.  There is no facial trauma or abnormalities.  Neck: No midline tenderness, no meningeal signs.  Full range of motion.  No JVD.    Cardiovascular: Heart is regular rate and rhythm S1-S2 sounds were auscultated without murmur click or rub  Respiratory: Lungs are clear to auscultation in all fields without wheeze rale or rhonchi.  GI: Abdomen is soft non tender normal bowel sounds are auscultated.  There is no rebound tenderness or guarding  Neuro cranial nerves II through XII are intact and normal.  Patient has normal speech and normal gait.  There is no muscle weakness in any extremities.  Sensation is intact throughout.   Psych: Patient is alert and oriented person place and time.  Patient is very pleasant converse with has a  euthymic affect.  There  are no signs of depression or anxiety.  Skin: No rash.  No petechiae or purpura.  The skin is warm and dry without diaphoresis.  There is no pallor.  Musculoskeletal:  The left leg is significantly edematous.  Specifically the left thigh is significantly larger compare to the right.  The entire left thigh is significantly tender compare to the right.  This is more than just tenderness from sciatic pain.  Additionally she has left calf swelling and significant tenderness.  Popliteal area is also tender to palpate.  Entire left thigh is erythematous in addition to be edematous.  Patient has significant tenderness to palpation to the left shoulder left trapezius area but this is normal for her.    GU: Deferred        Procedures      Patient Data   Labs Ordered/Reviewed - No data to display             No orders to display                Medical Decision Making  Feel that the patient has resultant deep vein thrombosis secondary to decreased activity from her pain from her degenerative disc disease of her lumbar spine and cervical spine.  Therefore patient was being transferred to main campus emergency department for ultrasound.  Ultrasound is not available at this department.    Problems Addressed:  DDD (degenerative disc disease), cervical: chronic illness or injury with exacerbation, progression, or side effects of treatment  DDD (degenerative disc disease), lumbar: chronic illness or injury with exacerbation, progression, or side effects of treatment  Leg pain: acute illness or injury  Lower extremity edema: acute illness or injury                     Medications Administered in the ED   morphine 2 mg/mL injection (2 mg IntraMUSCULAR Given 06/27/23 0809)   ondansetron (ZOFRAN ODT) rapid dissolve tablet (4 mg Oral Given 06/27/23 0809)                 Clinical Impression   Leg pain   Lower extremity edema (Primary)   DDD (degenerative disc disease), lumbar   DDD (degenerative disc disease), cervical           Transfered  to Another Facility      Current Discharge Medication List                  _______________________________  Alphonzo Cruise, D.O.  Emergency Medicine  [place] [facility]          This note may have been partially generated using MModal Fluency Direct system, and there may be some incorrect words, spellings, and punctuation that were not noted in checking the note before saving, though effort was made to avoid such errors.

## 2023-06-27 NOTE — ED Provider Notes (Signed)
[facility]  ED Primary Provider Note  History of Present Illness   Chief Complaint   Patient presents with    Hip Pain     left    Leg Pain     left     Terri Bautista is a 52 y.o. female who had concerns including Hip Pain and Leg Pain.  Arrival: The patient arrived by Car    Patient 52 year old female to the emergency department sent via EMS from [facility] to rule out deep vein thrombosis.  Patient states she has pain that starts in her low back and radiates down her left leg.  Patient states pain is the worst in her left calf.  Patient states that has been ongoing for the past 2 months, worsening this week.  Patient denies use of hormone replacement therapy, history of deep vein thrombosis, use of blood thinners, prolonged immobilization or travel.  Patient denies shortness breath or chest pain.      History Reviewed This Encounter: Medical History  Surgical History  Family History  Social History    Physical Exam   ED Triage Vitals [06/27/23 0743]   BP (Non-Invasive) 108/70   Heart Rate 85   Respiratory Rate 20   Temperature 36.7 C (98.1 F)   SpO2 99 %   Weight    Height      Physical Exam  Vitals and nursing note reviewed.   Constitutional:       General: She is not in acute distress.     Appearance: She is well-developed.   HENT:      Head: Normocephalic and atraumatic.   Eyes:      Conjunctiva/sclera: Conjunctivae normal.   Cardiovascular:      Rate and Rhythm: Normal rate and regular rhythm.      Heart sounds: No murmur heard.  Pulmonary:      Effort: Pulmonary effort is normal. No respiratory distress.      Breath sounds: Normal breath sounds.   Abdominal:      Palpations: Abdomen is soft.      Tenderness: There is no abdominal tenderness.   Musculoskeletal:         General: No swelling.      Cervical back: Neck supple.   Skin:     General: Skin is warm and dry.      Capillary Refill: Capillary refill takes less than 2 seconds.   Neurological:       Mental Status: She is alert.   Psychiatric:         Mood and Affect: Mood normal.       Patient Data   Labs Ordered/Reviewed   COMPREHENSIVE METABOLIC PANEL, NON-FASTING - Abnormal; Notable for the following components:       Result Value    GLUCOSE 110 (*)     CALCIUM, CORRECTED 8.8 (*)     All other components within normal limits    Narrative:     Estimated Glomerular Filtration Rate (eGFR) is calculated using the CKD-EPI (2021) equation, intended for patients 36 years of age and older. If gender is not documented or "unknown", there will be no eGFR calculation.     CBC WITH DIFF - Abnormal; Notable for the following components:    WBC 15.9 (*)     MPV 6.9 (*)     NEUTROPHIL % 79 (*)     LYMPHOCYTE % 13 (*)     EOSINOPHIL % 0 (*)  NEUTROPHIL # 12.60 (*)     MONOCYTE # 1.10 (*)     All other components within normal limits   PT/INR - Normal    Narrative:     INR OF 2.0-3.0  RECOMMENDED FOR: PROPHYLAXIS/TREATMENT OF VENEOUS THROMBOSIS, PULMONARY EMBOLISM, PREVENTION OF SYSTEMIC EMBOLISM FROM ATRIAL FIBRILATION, MYOCARDIAL INFARCTION.    INR OF 2.5-3.5  RECOMMENDED FOR MECHANICAL PROSTHETIC HEART VALVES, RECURRENT SYSTEMIC EMBOLISM, RECURRENT MYOCARDIAL INFARCTION.     PTT (PARTIAL THROMBOPLASTIN TIME) - Normal   CBC/DIFF    Narrative:     The following orders were created for panel order CBC/DIFF.  Procedure                               Abnormality         Status                     ---------                               -----------         ------                     CBC WITH HQIO[[id number]]                Abnormal            Final result                 Please view results for these tests on the individual orders.     No orders to display     Medical Decision Making        Medical Decision Making  Patient 52 year old female to the emergency department sent via EMS from [facility] to rule out deep vein thrombosis.  Patient states she has pain that starts in her low back and radiates down  her left leg.  Patient states pain is the worst in her left calf.  Patient states that has been ongoing for the past 2 months, worsening this week.  Patient denies use of hormone replacement therapy, history of deep vein thrombosis, use of blood thinners, prolonged immobilization or travel.  Patient denies shortness breath or chest pain.    Differential diagnosis include but are not limited to deep vein thrombosis, lumbar radiculopathy, leg strain.  On physical examination DP and PT palpable with capillary refill less than 3 seconds distal to pain.  Patient does have positive Homans sign.  Basic labs and Doppler ultrasound ordered of left lower extremity.  Ultrasound does show deep vein thrombosis in left lower extremity.  Patient was started on Eliquis and to follow-up with vascular and her primary care or return to ED if chest pain, decreased capillary refill or loss of pulse and foot, or shortness of breath.    Amount and/or Complexity of Data Reviewed  Labs: ordered.  Radiology:  Decision-making details documented in ED Course.    Risk  Prescription drug management.  Parenteral controlled substances.  Decision regarding hospitalization.      ED Course as of 06/27/23 1126   Sun Jun 27, 2023   1108 PERIPHERAL VENOUS DUPLEX - LOWER(!)            Medications Administered in the ED   morphine 2 mg/mL injection (2 mg IntraMUSCULAR Given 06/27/23 0809)   ondansetron (  ZOFRAN ODT) rapid dissolve tablet (4 mg Oral Given 06/27/23 0809)     Clinical Impression   Leg pain   Lower extremity edema (Primary)   DDD (degenerative disc disease), lumbar   DDD (degenerative disc disease), cervical       Disposition: Discharged

## 2023-06-27 NOTE — ED Nurses Note (Signed)
[facility] here to transport to [facility] ED.

## 2023-06-27 NOTE — ED Nurses Note (Signed)
Called report to [facility] at [facility] ED, verbalized understanding.

## 2023-06-27 NOTE — ED Nurses Note (Signed)
Pt educated on plan to transfer to [facility] ED for venous US. Pt verbalized understanding.

## 2023-06-27 NOTE — ED Triage Notes (Signed)
I can barely walk my left hip and all the way down to bottom of leg.  Lower back left arm pain etc.  Was suppose to have an MRI tomorrow but all parts didn't come through so no MRI.  Initial injury started in the left shoulder.  Ortho did a lot of testing in the neck area and think it might be a pinched nerve.  On going for 3 months.  My leg has been sore but this morning can't even walk.

## 2023-06-27 NOTE — ED Nurses Note (Signed)
Patient provided charges and educated on crutch use. Patient discharged home with family.  AVS reviewed with patient.  A written copy of the AVS and discharge instructions was given to the patient.  Questions sufficiently answered as needed.  Patient encouraged to follow up with PCP as indicated.  In the event of an emergency, patient instructed to call 911 or go to the nearest emergency room.

## 2023-06-28 ENCOUNTER — Inpatient Hospital Stay
Admission: EM | Admit: 2023-06-28 | Discharge: 2023-07-02 | DRG: 176 | Disposition: A | Discharge status: Home / Self Care | Attending: Internal Medicine | Admitting: Internal Medicine

## 2023-06-28 ENCOUNTER — Encounter (HOSPITAL_BASED_OUTPATIENT_CLINIC_OR_DEPARTMENT_OTHER): Payer: Self-pay

## 2023-06-28 ENCOUNTER — Other Ambulatory Visit: Payer: Self-pay

## 2023-06-28 ENCOUNTER — Emergency Department (HOSPITAL_BASED_OUTPATIENT_CLINIC_OR_DEPARTMENT_OTHER)

## 2023-06-28 DIAGNOSIS — Z87891 Personal history of nicotine dependence: Secondary | ICD-10-CM

## 2023-06-28 DIAGNOSIS — I82452 Acute embolism and thrombosis of left peroneal vein: Secondary | ICD-10-CM | POA: Diagnosis present

## 2023-06-28 DIAGNOSIS — M5412 Radiculopathy, cervical region: Secondary | ICD-10-CM | POA: Diagnosis present

## 2023-06-28 DIAGNOSIS — R9431 Abnormal electrocardiogram [ECG] [EKG]: Secondary | ICD-10-CM

## 2023-06-28 DIAGNOSIS — K219 Gastro-esophageal reflux disease without esophagitis: Secondary | ICD-10-CM | POA: Diagnosis present

## 2023-06-28 DIAGNOSIS — I82412 Acute embolism and thrombosis of left femoral vein: Secondary | ICD-10-CM | POA: Diagnosis present

## 2023-06-28 DIAGNOSIS — I82432 Acute embolism and thrombosis of left popliteal vein: Secondary | ICD-10-CM | POA: Diagnosis present

## 2023-06-28 DIAGNOSIS — F32A Depression, unspecified: Secondary | ICD-10-CM | POA: Diagnosis present

## 2023-06-28 DIAGNOSIS — R55 Syncope and collapse: Secondary | ICD-10-CM

## 2023-06-28 DIAGNOSIS — G44009 Cluster headache syndrome, unspecified, not intractable: Secondary | ICD-10-CM | POA: Diagnosis present

## 2023-06-28 DIAGNOSIS — N390 Urinary tract infection, site not specified: Secondary | ICD-10-CM | POA: Diagnosis present

## 2023-06-28 DIAGNOSIS — I824Z2 Acute embolism and thrombosis of unspecified deep veins of left distal lower extremity: Secondary | ICD-10-CM | POA: Diagnosis present

## 2023-06-28 DIAGNOSIS — I824Y2 Acute embolism and thrombosis of unspecified deep veins of left proximal lower extremity: Secondary | ICD-10-CM

## 2023-06-28 DIAGNOSIS — Z7901 Long term (current) use of anticoagulants: Secondary | ICD-10-CM

## 2023-06-28 DIAGNOSIS — F419 Anxiety disorder, unspecified: Secondary | ICD-10-CM | POA: Diagnosis present

## 2023-06-28 DIAGNOSIS — Z79899 Other long term (current) drug therapy: Secondary | ICD-10-CM

## 2023-06-28 DIAGNOSIS — I2699 Other pulmonary embolism without acute cor pulmonale: Principal | ICD-10-CM | POA: Diagnosis present

## 2023-06-28 DIAGNOSIS — R079 Chest pain, unspecified: Secondary | ICD-10-CM

## 2023-06-28 HISTORY — DX: Acute embolism and thrombosis of unspecified deep veins of unspecified lower extremity (CMS HCC): I82.409

## 2023-06-28 LAB — ECG 12 LEAD
Atrial Rate: 80 {beats}/min
Calculated P Axis: 36 degrees
Calculated R Axis: 76 degrees
Calculated T Axis: 9 degrees
PR Interval: 130 ms
QRS Duration: 84 ms
QT Interval: 390 ms
QTC Calculation: 449 ms
Ventricular rate: 80 {beats}/min

## 2023-06-28 LAB — CBC
HCT: 39.7 % (ref 37.0–47.0)
HGB: 13.4 g/dL (ref 12.5–16.0)
MCH: 29.6 pg (ref 27.0–32.0)
MCHC: 33.8 g/dL (ref 32.0–36.0)
MCV: 87.6 fL (ref 78.0–99.0)
MPV: 7.2 fL — ABNORMAL LOW (ref 7.4–10.4)
PLATELETS: 253 10*3/uL (ref 140–440)
RBC: 4.53 10*6/uL (ref 4.20–5.40)
RDW: 16.1 % — ABNORMAL HIGH (ref 11.6–14.8)
WBC: 15.9 10*3/uL — ABNORMAL HIGH (ref 4.0–10.5)

## 2023-06-28 LAB — PTT (PARTIAL THROMBOPLASTIN TIME)
APTT: 39.5 seconds — ABNORMAL HIGH (ref 25.0–38.0)
APTT: 47.4 seconds — ABNORMAL HIGH (ref 25.0–38.0)
APTT: 48.5 seconds — ABNORMAL HIGH (ref 25.0–38.0)

## 2023-06-28 MED ORDER — PANTOPRAZOLE 40 MG TABLET,DELAYED RELEASE
40.0000 mg | DELAYED_RELEASE_TABLET | Freq: Every day | ORAL | Status: DC
Start: 2023-06-29 — End: 2023-07-02
  Administered 2023-06-29 – 2023-07-02 (×4): 40 mg via ORAL
  Filled 2023-06-28 (×4): qty 1

## 2023-06-28 MED ORDER — APIXABAN 5 MG TABLET
10.0000 mg | ORAL_TABLET | Freq: Two times a day (BID) | ORAL | Status: DC
Start: 2023-06-28 — End: 2023-06-30

## 2023-06-28 MED ORDER — ONDANSETRON HCL (PF) 4 MG/2 ML INJECTION SOLUTION
4.0000 mg | Freq: Four times a day (QID) | INTRAMUSCULAR | Status: DC | PRN
Start: 2023-06-28 — End: 2023-07-02

## 2023-06-28 MED ORDER — IOHEXOL 350 MG IODINE/ML INTRAVENOUS SOLUTION
100.0000 mL | INTRAVENOUS | Status: AC
Start: 2023-06-28 — End: 2023-06-28
  Administered 2023-06-28: 100 mL via INTRAVENOUS

## 2023-06-28 MED ORDER — SERTRALINE 100 MG TABLET
100.0000 mg | ORAL_TABLET | Freq: Every day | ORAL | Status: DC
Start: 2023-06-29 — End: 2023-07-02
  Administered 2023-06-29 – 2023-07-02 (×4): 100 mg via ORAL
  Filled 2023-06-28 (×4): qty 1

## 2023-06-28 MED ORDER — ACETAMINOPHEN 325 MG TABLET
650.0000 mg | ORAL_TABLET | ORAL | Status: DC | PRN
Start: 2023-06-28 — End: 2023-07-02
  Administered 2023-06-28 – 2023-06-29 (×2): 650 mg via ORAL
  Filled 2023-06-28 (×2): qty 2

## 2023-06-28 MED ORDER — HEPARIN (PORCINE) 25,000 UNIT/250 ML (100 UNIT/ML) IN DEXTROSE 5 % IV
INTRAVENOUS | Status: AC
Start: 2023-06-28 — End: 2023-06-28
  Filled 2023-06-28: qty 250

## 2023-06-28 MED ORDER — FENTANYL (PF) 50 MCG/ML INJECTION SOLUTION
25.0000 ug | INTRAMUSCULAR | Status: AC
Start: 2023-06-28 — End: 2023-06-28
  Administered 2023-06-28: 25 ug via INTRAVENOUS

## 2023-06-28 MED ORDER — HEPARIN (PORCINE) 25,000 UNIT/250 ML (100 UNIT/ML) IN DEXTROSE 5 % IV
18.0000 [IU]/kg/h | INTRAVENOUS | Status: DC
Start: 2023-06-28 — End: 2023-06-30
  Administered 2023-06-28 – 2023-06-29 (×2): 18 [IU]/kg/h via INTRAVENOUS
  Administered 2023-06-30: 0 [IU]/kg/h via INTRAVENOUS
  Filled 2023-06-28 (×2): qty 250

## 2023-06-28 MED ORDER — FENTANYL (PF) 50 MCG/ML INJECTION SOLUTION
INTRAMUSCULAR | Status: AC
Start: 2023-06-28 — End: 2023-06-28
  Filled 2023-06-28: qty 2

## 2023-06-28 MED ORDER — IPRATROPIUM 0.5 MG-ALBUTEROL 3 MG (2.5 MG BASE)/3 ML NEBULIZATION SOLN
3.0000 mL | INHALATION_SOLUTION | RESPIRATORY_TRACT | Status: DC | PRN
Start: 2023-06-28 — End: 2023-07-02

## 2023-06-28 NOTE — ED Triage Notes (Signed)
Ems reports pt recently dx with DVT left leg, taking Eliquis. Pt c/o woke this am and was getting out of bed, felt dull heavy pressure in chest and shortness of breath. Pain just left of sternum 6/10. Ems iv 18 ga left ac, aspirin 243 mg and nitro x 1 with some relief

## 2023-06-28 NOTE — Care Plan (Signed)
Problem: Adult Inpatient Plan of Care  Goal: Plan of Care Review  Outcome: Ongoing (see interventions/notes)  Goal: Patient-Specific Goal (Individualized)  Outcome: Ongoing (see interventions/notes)  Flowsheets (Taken 06/28/2023 2130)  Individualized Care Needs: assist with ambulation  Anxieties, Fears or Concerns: none stated  Patient-Specific Goals (Include Timeframe): to get better by discharge  Goal: Absence of Hospital-Acquired Illness or Injury  Outcome: Ongoing (see interventions/notes)  Intervention: Identify and Manage Fall Risk  Recent Flowsheet Documentation  Taken 06/28/2023 2131 by Elana Alm, RN  Safety Promotion/Fall Prevention:   activity supervised   fall prevention program maintained  Intervention: Prevent and Manage VTE (Venous Thromboembolism) Risk  Recent Flowsheet Documentation  Taken 06/28/2023 2131 by Elana Alm, RN  VTE Prevention/Management: anticoagulant therapy maintained  Goal: Optimal Comfort and Wellbeing  Outcome: Ongoing (see interventions/notes)  Goal: Rounds/Family Conference  Outcome: Ongoing (see interventions/notes)     Problem: Fall Injury Risk  Goal: Absence of Fall and Fall-Related Injury  Outcome: Ongoing (see interventions/notes)  Intervention: Promote Injury-Free Environment  Recent Flowsheet Documentation  Taken 06/28/2023 2131 by Elana Alm, RN  Safety Promotion/Fall Prevention:   activity supervised   fall prevention program maintained     Problem: Pain Acute  Goal: Optimal Pain Control and Function  Outcome: Ongoing (see interventions/notes)     Problem: Chest Pain  Goal: Resolution of Chest Pain Symptoms  Outcome: Ongoing (see interventions/notes)

## 2023-06-28 NOTE — ED Nurses Note (Signed)
Report called to [place] on 2W at [facility]

## 2023-06-28 NOTE — H&P (Signed)
[facility]  Admission H&P        Date of Service:  06/28/2023  Patient Name:  Terri Bautista y.o. female  Date of Admission:  06/28/2023  Date of Birth:  [birth year]      Chief Complaint:    Chief Complaint   Patient presents with    Chest Pain        HPI: Terri Bautista is a 52 y.o., White female who presents with shortness of breath. She presented to the ER on 06/27/23 with left lower extremity swelling and tenderness. Venous duplex showed DVT within the left common femoral vein, femoral vein, popliteal vein, peroneal vein, and posterior tibial vein. Patient was started on Eliquis 10 mg BID and discharged home. She noticed shortness of breath and dyspnea this morning, patient went back to the ER for re-evaluation. CTA showed right lower lobe pulmonary embolism involving the subsegmental branch. She was started on a heparin drip. Upon further questioning, patient reports she had a syncopal event in May 2024. No personal or family history of clotting disorders. She is not on any birth control. Non-smoker.     History:    Past Medical:    Past Medical History:   Diagnosis Date    Depression     DVT (deep venous thrombosis) (CMS HCC)      Past Surgical:    Past Surgical History:   Procedure Laterality Date    HX TONSILLECTOMY      HX TUBAL LIGATION       Family:    Family Medical History:    None       Social:   reports that she has quit smoking. Her smoking use included cigarettes. She has never used smokeless tobacco. She reports that she does not currently use alcohol. She reports that she does not use drugs.    Allergies   Allergen Reactions    Bactrim [Sulfamethoxazole-Trimethoprim] Rash    Cephalexin      Medications Prior to Admission       Prescriptions    acetaminophen (TYLENOL) 325 mg Oral Tablet    Take 1 Tablet (325 mg total) by mouth Every 4 hours as needed for Pain    apixaban (ELIQUIS) 5 mg Oral Tablet    Take 2 Tablets (10 mg total) by mouth Twice daily for 7 days, THEN 1 Tablet (5 mg total)  Twice daily for 23 days. Indications: a clot in the lung, blood clot in a deep vein of the extremities.    gabapentin (NEURONTIN) 300 mg Oral Capsule    Take 1 Capsule (300 mg total) by mouth    meloxicam (MOBIC) 15 mg Oral Tablet    Take 1 Tablet (15 mg total) by mouth Once a day for 10 days    Methylprednisolone (MEDROL DOSEPACK) 4 mg Oral Tablets, Dose Pack    Take as instructed.    pantoprazole (PROTONIX) 40 mg Oral Tablet, Delayed Release (E.C.)    Take 1 Tablet (40 mg total) by mouth Once a day for 14 days Indications: gi protection    sertraline (ZOLOFT) 100 mg Oral Tablet    Take 1 Tablet (100 mg total) by mouth Once a day    tiZANidine (ZANAFLEX) 4 mg Oral Tablet    Take 1 Tablet (4 mg total) by mouth Three times a day          acetaminophen (TYLENOL) tablet, 650 mg, Oral, Q4H PRN  [Held by provider] apixaban (ELIQUIS) tablet, 10 mg, Oral, 2x/day  heparin 25,000 units in D5W 250 mL infusion, 18 Units/kg/hr (Adjusted), Intravenous, Continuous  ipratropium-albuterol 0.5 mg-3 mg(2.5 mg base)/3 mL Solution for Nebulization, 3 mL, Nebulization, Q4H PRN  ondansetron (ZOFRAN) 2 mg/mL injection, 4 mg, Intravenous, Q6H PRN  [START ON 06/29/2023] pantoprazole (PROTONIX) delayed release tablet, 40 mg, Oral, Daily  [START ON 06/29/2023] sertraline (ZOLOFT) tablet, 100 mg, Oral, Daily        ROS:   Review of Systems   Respiratory:  Positive for chest tightness and shortness of breath.    Cardiovascular:  Positive for leg swelling (left lower extremity).   Neurological:  Positive for syncope (in May 2024). Negative for dizziness and light-headedness.       All other systems negative unless marked.       Exam:  Vitals:    06/28/23 1500 06/28/23 1800 06/28/23 1957 06/28/23 2051   BP: 121/77 118/68     Pulse: (!) 112 93  98   Resp:       Temp:       SpO2: 91% 90% 95%    Weight:       Height:       BMI:             No intake or output data in the 24 hours ending 06/28/23 2118     Physical Exam:   Physical Exam  Constitutional:        General: She is not in acute distress.  HENT:      Mouth/Throat:      Mouth: Mucous membranes are moist.      Pharynx: Oropharynx is clear.   Eyes:      Extraocular Movements: Extraocular movements intact.      Conjunctiva/sclera: Conjunctivae normal.      Pupils: Pupils are equal, round, and reactive to light.   Cardiovascular:      Rate and Rhythm: Normal rate and regular rhythm.      Pulses: Normal pulses.      Heart sounds: Normal heart sounds. No murmur heard.     No friction rub. No gallop.   Pulmonary:      Effort: Pulmonary effort is normal.      Breath sounds: Normal breath sounds. No wheezing, rhonchi or rales.   Abdominal:      General: Abdomen is flat. Bowel sounds are normal.      Palpations: Abdomen is soft.   Musculoskeletal:      Right lower leg: No edema.      Left lower leg: Edema (tight, warm, and tender to palpation) present.   Skin:     General: Skin is warm and dry.   Neurological:      General: No focal deficit present.      Mental Status: She is alert and oriented to person, place, and time.         Labs:     Results for orders placed or performed during the hospital encounter of 06/28/23 (from the past 24 hour(s))   CBC   Result Value Ref Range    WBC 15.9 (H) 4.0 - 10.5 x10^3/uL    RBC 4.53 4.20 - 5.40 x10^6/uL    HGB 13.4 12.5 - 16.0 g/dL    HCT 81.1 91.4 - 78.2 %    MCV 87.6 78.0 - 99.0 fL    MCH 29.6 27.0 - 32.0 pg    MCHC 33.8 32.0 - 36.0 g/dL    RDW 95.6 (H) 21.3 - 14.8 %  PLATELETS 253 140 - 440 x10^3/uL    MPV 7.2 (L) 7.4 - 10.4 fL   PTT (PARTIAL THROMBOPLASTIN TIME)   Result Value Ref Range    APTT 39.5 (H) 25.0 - 38.0 seconds   PTT (PARTIAL THROMBOPLASTIN TIME)   Result Value Ref Range    APTT 47.4 (H) 25.0 - 38.0 seconds        Imaging Studies:    CT ANGIO CHEST FOR PULMONARY EMBOLUS W IV CONTRAST   Final Result   THERE IS A SINGLE RIGHT LOWER LOBE PULMONARY EMBOLISM. THE VENTRICULAR RATIO IS 0.81.      Findings of the pulmonary embolism were discussed with Dr. Katrinka Blazing at  9:58 AM on 06/28/2023.         One or more dose reduction techniques were used (e.g., Automated exposure control, adjustment of the mA and/or kV according to patient size, use of iterative reconstruction technique).         Radiologist location ID: [id number]             Code Status:  No Order    Assessment/Plan:   Active Hospital Problems    Diagnosis    Primary Problem: Pulmonary emboli (CMS HCC)       Pulmonary embolism  Diagnosed with left lower extremity DVT on 06/27/2023 and started on Eliquis 10 mg BID.   CTA on 06/28/23 shows right lower lobe branch pulmonary embolism with subsegmental involvement.   Heparin drip with protocol and will transition back to oral therapy.   Patient reports syncopal event in May 2024. In light of syncope with unprovoked DVT/PE, echocardiogram ordered.   Other  Resume home medications as appropriate.     DVT/PE Prophylaxis: Heparin drip  Disposition Planning: Home      Hospitalist personally evaluated and examined the patient in conjunction with the MLP and agree with the assessments, treatment plan and disposition of the patient as recorded by the [facility].       Collier Salina, PA-C  [facility] Medicine Hospitalist

## 2023-06-28 NOTE — ED Provider Notes (Signed)
[facility]  ED Primary Provider Note  Patient Name: Terri Bautista  Patient Age: 52 y.o.  Date of Birth: 1971-05-26    Chief Complaint: Chest Pain         History of Present Illness       Terri Bautista is a 52 y.o. female who had concerns including Chest Pain .     HPI     chief complaint chest pain.      HPI  Location left lower chest   Onset acute   Timing this morning   Severity moderate   Quality sharp stabbing pain   Modifying factors: States it is worse when she takes a deep breath or moves.    Mechanism: Patient was evaluated by me yesterday and was diagnosed with a very large deep vein thrombosis to the left lower extremity.  She was stabilized at [facility] me hospital after getting her ultrasound was discharged home with anticoagulation.  Additional symptoms: She has no tachycardia currently.  She has shortness of breath.  She has no hemoptysis.  She does have chest pain.  She has no palpitations.  She has left leg pain and swelling is noted from yesterday.  She also has continued left arm left shoulder pain from her neck.    Special considerations: She was scheduled to have an MRI of her left shoulder today.  However, she greatly needs an MRI of her cervical spine also.  I will relate to the team that this needs to be ordered also.  PMH: reviewed and listed on the chart below.   SH: reviewed and listed on the chart below.   Social History: reviewed and listed on the chart below.   Family History: reviewed and listed on the chart below.       PMHx:    Past Medical History:   Diagnosis Date    Depression     DVT (deep venous thrombosis) (CMS HCC)     PSHx:   Past Surgical History:   Procedure Laterality Date    HX TONSILLECTOMY      HX TUBAL LIGATION         Allergies:    Allergies   Allergen Reactions    Bactrim [Sulfamethoxazole-Trimethoprim] Rash    Cephalexin     Social History  Social History     Tobacco Use    Smoking status: Former     Types: Cigarettes    Smokeless tobacco:  Never   Vaping Use    Vaping status: Never Used   Substance Use Topics    Alcohol use: Not Currently    Drug use: Never       Family History  Family Medical History:    None            Home Meds:      Prior to Admission medications    Medication Sig Start Date End Date Taking? Authorizing Provider   acetaminophen (TYLENOL) 325 mg Oral Tablet Take 1 Tablet (325 mg total) by mouth Every 4 hours as needed for Pain    Provider, Historical   apixaban (ELIQUIS) 5 mg Oral Tablet Take 2 Tablets (10 mg total) by mouth Twice daily for 7 days, THEN 1 Tablet (5 mg total) Twice daily for 23 days. Indications: a clot in the lung, blood clot in a deep vein of the extremities. 06/27/23 07/27/23  Quick, Cydney, FNP-BC   gabapentin (NEURONTIN) 300 mg Oral Capsule Take 1 Capsule (300 mg total) by mouth  Provider, Historical   meloxicam (MOBIC) 15 mg Oral Tablet Take 1 Tablet (15 mg total) by mouth Once a day for 10 days 06/22/23 07/02/23  Judeth Cornfield, FNP-BC   Methylprednisolone (MEDROL DOSEPACK) 4 mg Oral Tablets, Dose Pack Take as instructed. 06/22/23   Judeth Cornfield, FNP-BC   pantoprazole (PROTONIX) 40 mg Oral Tablet, Delayed Release (E.C.) Take 1 Tablet (40 mg total) by mouth Once a day for 14 days Indications: gi protection 06/22/23 07/06/23  Judeth Cornfield, FNP-BC   sertraline (ZOLOFT) 100 mg Oral Tablet Take 1 Tablet (100 mg total) by mouth Once a day    Provider, Historical   tiZANidine (ZANAFLEX) 4 mg Oral Tablet Take 1 Tablet (4 mg total) by mouth Three times a day 06/22/23   Judeth Cornfield, FNP-BC   apixaban (ELIQUIS) 5 mg Oral Tablet Take 2 Tablets (10 mg total) by mouth Twice daily for 7 days, THEN 1 Tablet (5 mg total) Twice daily for 23 days. Indications: a clot in the lung, blood clot in a deep vein of the extremities. 06/27/23 06/27/23  Quick, Cydney, FNP-BC   Ibuprofen (MOTRIN) 600 mg Oral Tablet Take 1 Tablet (600 mg total) by mouth Four times a day as needed for Pain  06/22/23  Provider, Historical    methocarbamoL (ROBAXIN) 500 mg Oral Tablet Take 1 Tablet (500 mg total) by mouth Four times a day  06/22/23  Provider, Historical          ROS: A total of 10 systems were reviewed by me at the time of the visit. All are negative except the HPI and the following.           Physical Exam   ED Triage Vitals [06/28/23 0826]   BP (Non-Invasive) 109/71   Heart Rate 86   Respiratory Rate 16   Temperature 37.6 C (99.6 F)   SpO2 96 %   Weight 72.6 kg (160 lb)   Height 1.651 m (5\' 5" )         Physical exam  Constitutional/general: The nursing notes were reviewed and agreed upon.  The vital signs were reviewed and are listed on the chart.   52 year old female known to me from yesterday's visit.  She is currently in no acute distress.  Patient appears to be in a moderate amount of acute pain.    HEENT: Eyes show normal extraocular movements.  Well-hydrated oral mucosa is noted.  There is no facial trauma or abnormalities.  Neck: No midline tenderness, no meningeal signs.  Full range of motion.  No JVD.    Cardiovascular: Heart is regular rate and rhythm S1-S2 sounds were auscultated without murmur click or rub  Respiratory: Lungs are clear to auscultation in all fields without wheeze rale or rhonchi.  GI: Abdomen is soft non tender normal bowel sounds are auscultated.  There is no rebound tenderness or guarding  Neuro cranial nerves II through XII are intact and normal.  Patient has normal speech and normal gait.  There is no muscle weakness in any extremities.  Sensation is intact throughout.   Psych: Patient is alert and oriented person place and time.  Patient is very pleasant converse with has a euthymic affect.  Patient was apprehensive but not anxious   Skin: No rash.  No petechiae or purpura.  The skin is warm and dry without diaphoresis.  There is no pallor.  Musculoskeletal:  Significant swelling to the left thigh and left lower leg.  She has tenderness palpation  to virtually all portions of the left lower  extremity.  Patient also has tenderness palpation to the left paravertebral musculature of the thoracic spine from T1 through T7.  She has exquisite tenderness palpation to the left shoulder girdle.  She also has severe tenderness palpation to the left trapezius superior muscle body.  GU: Deferred        Procedures      Patient Data   Labs Ordered/Reviewed - No data to display        CT ANGIO CHEST FOR PULMONARY EMBOLUS W IV CONTRAST   Final Result by Edi, Radresults In (09/16 1000)   THERE IS A SINGLE RIGHT LOWER LOBE PULMONARY EMBOLISM. THE VENTRICULAR RATIO IS 0.81.      Findings of the pulmonary embolism were discussed with Dr. Katrinka Blazing at 9:58 AM on 06/28/2023.         One or more dose reduction techniques were used (e.g., Automated exposure control, adjustment of the mA and/or kV according to patient size, use of iterative reconstruction technique).         Radiologist location ID: [id number]                     Medical Decision Making  Patient is currently on anticoagulation was started yesterday.  However I do feel that she needs to be further stabilized as an inpatient since she has a pulmonary embolism now.  This will also give [place] an opportunity due to do an MRI of the cervical spine to help manage her overall pain.    Problems Addressed:  Acute deep vein thrombosis of lower leg, left (CMS HCC): acute illness or injury  Acute deep vein thrombosis of proximal leg, left (CMS HCC): acute illness or injury  Cervical radiculopathy: chronic illness or injury  Other acute pulmonary embolism without acute cor pulmonale (CMS HCC): acute illness or injury    Amount and/or Complexity of Data Reviewed  Radiology: ordered and independent interpretation performed. Decision-making details documented in ED Course.     Details: I personally viewed all radiologic studies at the time of the visit. I agree with the radiologist.      Risk  Decision regarding hospitalization.                       Medications Administered in the  ED   iohexol (OMNIPAQUE 350) infusion (100 mL Intravenous Given 06/28/23 0940)                 Clinical Impression   Cervical radiculopathy   Other acute pulmonary embolism without acute cor pulmonale (CMS HCC) (Primary)   Acute deep vein thrombosis of proximal leg, left (CMS HCC)   Acute deep vein thrombosis of lower leg, left (CMS HCC)           Data Unavailable      Current Discharge Medication List                  _______________________________  Alphonzo Cruise, D.O.  Emergency Medicine  [facility]          This note may have been partially generated using MModal Fluency Direct system, and there may be some incorrect words, spellings, and punctuation that were not noted in checking the note before saving, though effort was made to avoid such errors.

## 2023-06-28 NOTE — ED Nurses Note (Signed)
Tried to call report; no answer.

## 2023-06-28 NOTE — ED Nurses Note (Signed)
Patient resting in bed.  Call light in reach.  Family at bedside.  Patient is on cardiac monitor.  No further complaints at this time.

## 2023-06-28 NOTE — ED Nurses Note (Signed)
[place]  EMS here at this time for transport to [facility]

## 2023-06-29 ENCOUNTER — Inpatient Hospital Stay (HOSPITAL_COMMUNITY)

## 2023-06-29 DIAGNOSIS — I824Z2 Acute embolism and thrombosis of unspecified deep veins of left distal lower extremity: Secondary | ICD-10-CM

## 2023-06-29 DIAGNOSIS — R55 Syncope and collapse: Secondary | ICD-10-CM

## 2023-06-29 DIAGNOSIS — M5412 Radiculopathy, cervical region: Secondary | ICD-10-CM

## 2023-06-29 LAB — COMPREHENSIVE METABOLIC PANEL, NON-FASTING
ALBUMIN/GLOBULIN RATIO: 1.4 (ref 0.8–1.4)
ALBUMIN: 4.3 g/dL (ref 3.5–5.7)
ALKALINE PHOSPHATASE: 87 U/L (ref 34–104)
ALT (SGPT): 26 U/L (ref 7–52)
ANION GAP: 11 mmol/L (ref 4–13)
AST (SGOT): 16 U/L (ref 13–39)
BILIRUBIN TOTAL: 0.9 mg/dL (ref 0.3–1.0)
BUN/CREA RATIO: 16 (ref 6–22)
BUN: 11 mg/dL (ref 7–25)
CALCIUM, CORRECTED: 8.6 mg/dL — ABNORMAL LOW (ref 8.9–10.8)
CALCIUM: 8.8 mg/dL (ref 8.6–10.3)
CHLORIDE: 102 mmol/L (ref 98–107)
CO2 TOTAL: 21 mmol/L (ref 21–31)
CREATININE: 0.69 mg/dL (ref 0.60–1.30)
ESTIMATED GFR: 104 mL/min/{1.73_m2} (ref >59)
GLOBULIN: 3.1 (ref 2.9–5.4)
GLUCOSE: 132 mg/dL — ABNORMAL HIGH (ref 74–109)
OSMOLALITY, CALCULATED: 270 mOsm/kg (ref 270–290)
POTASSIUM: 3.5 mmol/L (ref 3.5–5.1)
PROTEIN TOTAL: 7.4 g/dL (ref 6.4–8.9)
SODIUM: 134 mmol/L — ABNORMAL LOW (ref 136–145)

## 2023-06-29 LAB — CBC WITH DIFF
BASOPHIL #: 0.1 10*3/uL (ref 0.00–0.10)
BASOPHIL %: 1 % (ref 0–1)
EOSINOPHIL #: 0.1 10*3/uL (ref 0.00–0.50)
EOSINOPHIL %: 1 % (ref 1–7)
HCT: 36.5 % (ref 31.2–41.9)
HGB: 12.6 g/dL (ref 10.9–14.3)
LYMPHOCYTE #: 2.1 10*3/uL (ref 1.00–3.00)
LYMPHOCYTE %: 15 % — ABNORMAL LOW (ref 16–44)
MCH: 29 pg (ref 24.7–32.8)
MCHC: 34.4 g/dL (ref 32.3–35.6)
MCV: 84.3 fL (ref 75.5–95.3)
MONOCYTE #: 0.9 10*3/uL (ref 0.30–1.00)
MONOCYTE %: 6 % (ref 5–13)
MPV: 6.9 fL — ABNORMAL LOW (ref 7.9–10.8)
NEUTROPHIL #: 10.8 10*3/uL — ABNORMAL HIGH (ref 1.85–7.80)
NEUTROPHIL %: 77 % (ref 43–77)
PLATELETS: 246 10*3/uL (ref 140–440)
RBC: 4.33 10*6/uL (ref 3.63–4.92)
RDW: 13.3 % (ref 12.3–17.7)
WBC: 14 10*3/uL — ABNORMAL HIGH (ref 3.8–11.8)

## 2023-06-29 LAB — PTT (PARTIAL THROMBOPLASTIN TIME)
APTT: 50.8 seconds — ABNORMAL HIGH (ref 25.0–38.0)
APTT: 43.6 seconds — ABNORMAL HIGH (ref 25.0–38.0)

## 2023-06-29 LAB — MAGNESIUM: MAGNESIUM: 2.1 mg/dL (ref 1.9–2.7)

## 2023-06-29 LAB — COVID-19, FLU A/B, RSV RAPID BY PCR
INFLUENZA VIRUS TYPE A: NOT DETECTED
INFLUENZA VIRUS TYPE B: NOT DETECTED
RESPIRATORY SYNCTIAL VIRUS (RSV): NOT DETECTED
SARS-CoV-2: NOT DETECTED

## 2023-06-29 LAB — PT/INR
INR: 1.38 — ABNORMAL HIGH (ref 0.84–1.10)
PROTHROMBIN TIME: 16.2 seconds — ABNORMAL HIGH (ref 9.8–12.7)

## 2023-06-29 MED ORDER — HYDROCODONE 5 MG-ACETAMINOPHEN 325 MG TABLET
1.0000 | ORAL_TABLET | ORAL | Status: DC | PRN
Start: 2023-06-29 — End: 2023-07-02
  Administered 2023-06-29 – 2023-07-01 (×6): 1 via ORAL
  Filled 2023-06-29 (×6): qty 1

## 2023-06-29 NOTE — Nurses Notes (Signed)
Patients temperature was 102.4. Gave PRN tylenol. Rechecked temp 98.2. Patient also given PRN Norco for pain. Will continue to monitor.

## 2023-06-29 NOTE — Care Plan (Signed)
Patient admitted on 9/16 for pulmonary emboli. Patient on heparin drip. PTT within no change range. Patient unable to walk due to left leg swelling. Patient complaining of straining when urinating. Patient resting in bed with husband at bedside. Discharge planing initiated and will depend on clinical course.  Problem: Adult Inpatient Plan of Care  Goal: Plan of Care Review  Outcome: Ongoing (see interventions/notes)     Problem: Adult Inpatient Plan of Care  Goal: Patient-Specific Goal (Individualized)  Outcome: Ongoing (see interventions/notes)  Flowsheets (Taken 06/29/2023 0837)  Individualized Care Needs: Assist with adl's  Anxieties, Fears or Concerns: Anxious about dvt  Patient-Specific Goals (Include Timeframe): discharge to home

## 2023-06-29 NOTE — Consults (Signed)
Department of Hematology/Oncology   ONCOLOGY CONSULT NOTE      REFERRING PROVIDER:  No referring provider defined for this encounter.        Reason for Consult:  Unprovoked deep vein thrombosis PE  PCP:    HISTORY OF PRESENT ILLNESS:  Terri Bautista is a 52 y.o. female who presented to the hospital for evaluation of "lower left chest pain".  Patient was seen in the ER 06/27/2023 for low back pain that radiated down her left leg.  Pain was worse in her left calf.  Peripheral venous duplex showed a left lower extremity deep vein thrombosis in the left common femoral vein, femoral vein, popliteal vein, peroneal vein and posterior tibial vein.  Patient was discharged with Eliquis 10 mg b.i.d. for seven days and then decrease to one tablet b.i.d..  On 06/28/2023 patient presented to the ER with left lower chest pain and shortness of breath.  CTA of the chest showed right lower lobe pulmonary embolism involving the segmental branch.  Patient was admitted and started on heparin drip.  Hematology/oncology consultation was requested for evaluation of unprovoked deep vein thrombosis/PE.    Patient denies any recent travel, smoking, prolonged immobility, recent illness, OCP or hormone use, trauma and is unaware of any family history of blood clots.  Patient reports one week prior to diagnosis she was noticing some pain with walking in her lower calves and having swelling that would decrease with elevation.  She reports that her last mammogram was approximately two years ago and was normal.  She does not complete self-breast exams.  Unsure of last colonoscopy the patient does report issues with IBS.  She denies any blood in her stools.  Patient reports that in May of 2024 she passed out in her bathroom and hit her head neck on a metal trash can.  She reports that she got up and had washed her hands and was starting to leave the restroom when she passed out.  Clinical history from that ER visit states that she stood up from  the toilet became dizzy and then fell.  Husband reports that after this syncopal episode she was only mumbling and was out of it.  She was seen in the ER and CT of the brain was normal.  Since that time she has had issues with her left shoulder and left side of her body she is currently seeing an orthopedist for these issues.  She has also had issues with what is believed to be vertigo since June or July of this year.  She does report issues with headaches that are in the frontal area that she states were cluster headaches.  These used to her at least weekly but she notices a decrease in those presently.    Patient is unknown to [facility].     Pertinent admission diagnostics:  06/27/2023 deep vein thrombosis in the left common femoral vein, femoral vein, popliteal vein, peroneal vein, and posterior tibial vein.  06/28/2023 single right lower lobe pulmonary embolism involving a subsegmental branch    SUBJECTIVE:  Patient is awake alert lying in bed.  Husband at bedside.  Patient presently denies any issues with shortness of breath or chest pain.  She is currently on a heparin drip.    ROS:   Consitutional: energy level good, appetite good, no weight loss, no fever, no chills  HEENT: No epistaxis, no dysphagia, no gum bleeding  Cardiovascular: no chest pain, no palpitations, no orthopnea  Respiratory: No cough,  no hemoptysis, no dyspnea, no pleurisy,m no wheezing  Gastrointestinal: No nausea, no vomiting, no hematemesis, no diarrhea, no constipation, no melena  Musculoskeletal: No swollen joints, no joint redness  Skin: No Rash, No nodules, no pruritus, no lesions  Neurologic: No seizures, no headache, no paresthesia  Psychiatric: no depression, no anxiety  Endocrine: no polyuria, no polydipsia    HISTORY:  Past Medical History:   Diagnosis Date    Depression     DVT (deep venous thrombosis) (CMS HCC)          Past Surgical History:   Procedure Laterality Date    HX TONSILLECTOMY      HX TUBAL  LIGATION           Social History     Socioeconomic History    Marital status: Married     Spouse name: Not on file    Number of children: Not on file    Years of education: Not on file    Highest education level: Not on file   Occupational History    Not on file   Tobacco Use    Smoking status: Former     Types: Cigarettes    Smokeless tobacco: Never   Vaping Use    Vaping status: Never Used   Substance and Sexual Activity    Alcohol use: Not Currently    Drug use: Never    Sexual activity: Not on file   Other Topics Concern    Not on file   Social History Narrative    Not on file     Social Determinants of Health     Financial Resource Strain: Not on file   Transportation Needs: Low Risk  (06/28/2023)    Transportation Needs     SDOH Transportation: No   Social Connections: Low Risk  (06/28/2023)    Social Connections     SDOH Social Isolation: 5 or more times a week   Intimate Partner Violence: Not on file   Housing Stability: Not on file     Family Medical History:    None         No outpatient medications have been marked as taking for the 06/28/23 encounter [facility] Encounter).      Allergies   Allergen Reactions    Bactrim [Sulfamethoxazole-Trimethoprim] Rash    Cephalexin        PHYSICAL EXAM:    BP 125/75   Pulse 99   Temp 38 C (100.4 F)   Resp 18   Ht 1.651 m (5\' 5" )   Wt 72.6 kg (160 lb 0.9 oz)   SpO2 94%   BMI 26.63 kg/m          ECOG Status: (0) Fully active, able to carry on all predisease performance without restriction      General: Awake, alert, NAD  HEENT: Head normocephalic, atraumatic.  Mucouse membranes moist.  PERRLA, no pallor, no icterus  Adenopathy: No palpable cervical, axillary, or inguinal adenopathy noted  Neck: No JVD, Supple, no adenopathy, No JVD  Lungs: Clear to auscultation bilaterally.  No rales, rhonchi, no wheezing  Breast exam:  Right and left breast exam completed without evidence of disease, mass or skin lesions.  Cardiovascular: Regular rate and rhythm, No murmur, no  friction rub  Abdomen: Soft, Symmetric, No tenderness, No hepatosplenomegaly, BSA x4  Extremities: No peripheral edema. Warm. No calf tenderness  Skin: Skin warm and dry.  No suspicious lesions  Neurologic: Alert and oriented x3. Cranial nerves  II through XII intact. No focal paralysis or asterixis  Psych: Mood and affect congruent for age and gender, Denies SI/HI       DIAGNOSTIC DATA:  No images are attached to the encounter.     LABS:     Basic Metabolic Profile    Lab Results   Component Value Date/Time    SODIUM 134 (L) 06/29/2023 03:53 AM    POTASSIUM 3.5 06/29/2023 03:53 AM    CHLORIDE 102 06/29/2023 03:53 AM    CO2 21 06/29/2023 03:53 AM    ANIONGAP 11 06/29/2023 03:53 AM    Lab Results   Component Value Date/Time    BUN 11 06/29/2023 03:53 AM    CREATININE 0.69 06/29/2023 03:53 AM    GLUCOSENF 132 (H) 06/29/2023 03:53 AM          CBC  Diff   Lab Results   Component Value Date/Time    WBC 14.0 (H) 06/29/2023 03:53 AM    HGB 12.6 06/29/2023 03:53 AM    HCT 36.5 06/29/2023 03:53 AM    PLTCNT 246 06/29/2023 03:53 AM    RBC 4.33 06/29/2023 03:53 AM    MCV 84.3 06/29/2023 03:53 AM    MCHC 34.4 06/29/2023 03:53 AM    MCH 29.0 06/29/2023 03:53 AM    RDW 13.3 06/29/2023 03:53 AM    MPV 6.9 (L) 06/29/2023 03:53 AM    Lab Results   Component Value Date/Time    PMNS 77 06/29/2023 03:53 AM    LYMPHOCYTES 15 (L) 06/29/2023 03:53 AM    EOSINOPHIL 1 06/29/2023 03:53 AM    MONOCYTES 6 06/29/2023 03:53 AM    BASOPHILS 1 06/29/2023 03:53 AM    BASOPHILS 0.10 06/29/2023 03:53 AM    PMNABS 10.80 (H) 06/29/2023 03:53 AM    LYMPHSABS 2.10 06/29/2023 03:53 AM    EOSABS 0.10 06/29/2023 03:53 AM    MONOSABS 0.90 06/29/2023 03:53 AM            ASSESSMENT:    ICD-10-CM    1. Other acute pulmonary embolism without acute cor pulmonale (CMS HCC)  I26.99       2. Cervical radiculopathy  M54.12       3. Acute deep vein thrombosis of proximal leg, left (CMS HCC)  I82.4Y2       4. Acute deep vein thrombosis of lower leg, left (CMS HCC)   I82.4Z2       5. Syncope  R55 TRANSTHORACIC ECHOCARDIOGRAM - ADULT        PE/left Leg DVT:  Unprovoked.    Hypercoagulable testing ordered.   Not considered a failure on Eliquis, due to <24 hours on medication  Currently on Heparin drip       PLAN:   1.) From a hematology perspective we recommend transition to  Eliquis 10 mg PO BID x 7 days and then decrease to 5 mg PO BID.    Transition when Medical team is agreeable.    2.) Patient will need a minimum of 3 months of anticoagulation following discharge.  Will need follow up Left leg venous duplex for resolution of clot before stopping Eliquis.   3.) Following discharge patient will follow up with outpatient [facility] [place].    Thank you for this consultation and allowing [place] to participate in the care of this patient.        Gladyes Perdomo was given the chance to ask questions, and these were answered to their satisfaction. The patient is welcome to  call with any questions or concerns in the meantime.     Thank you for this consultation and allowing [place] to participate in this patients care.    Patient seen independently today, Case was discussed with Primary hematologist/oncologist Dr. Melynda Keller, The above plan was advised, ordered, and implemented.     Marvene Staff APRN, FNP-BC, AOCNP, 06/29/2023 , 17:07       CC:  [facility]  [address]  [place] [address]    No referring provider defined for this encounter.

## 2023-06-29 NOTE — Nurses Notes (Signed)
Patients admission complete. She is currently not requiring oxygen. Pain rating 4/10 LLE. All vitals stable at this time. Call bell and fluids are within reach. No signs of distress at this time.

## 2023-06-29 NOTE — Progress Notes (Signed)
[facility]   Progress Note    Cira Milward  Date of service: 06/29/2023  Date of Admission:  06/28/2023  Hospital Day:  LOS: 1 day     Subjective:  No overnight events reported.  The patient endorsed mild pleuritic chest discomfort, her she denies any palpitations nausea/emesis since arrival.  She was accompanied by her husband at the time of the evaluation.    The results of the most recent imaging studies/laboratory results were reviewed with the patient family in detail.  The care plan including plans for hematology evaluation were also discussed.    Compliance with incentive spirometry encouraged.    Review of Systems:    As mentioned above.     Objective:      Vital Signs:  Vitals:    06/28/23 2354 06/29/23 0002 06/29/23 0100 06/29/23 0837   BP:    121/74   Pulse:    90   Resp:    18   Temp: (!) 39.1 C (102.4 F)  36.8 C (98.2 F) 36.7 C (98 F)   SpO2:    93%   Weight:  72.6 kg (160 lb 0.9 oz)     Height:  1.651 m (5\' 5" )     BMI:  26.69       I/O:  I/O last 24 hours:  No intake or output data in the 24 hours ending 06/29/23 1120  acetaminophen (TYLENOL) tablet, 650 mg, Oral, Q4H PRN  [Held by provider] apixaban (ELIQUIS) tablet, 10 mg, Oral, 2x/day  heparin 25,000 units in D5W 250 mL infusion, 18 Units/kg/hr (Adjusted), Intravenous, Continuous  HYDROcodone-acetaminophen (NORCO) 5-325 mg per tablet, 1 Tablet, Oral, Q4H PRN  ipratropium-albuterol 0.5 mg-3 mg(2.5 mg base)/3 mL Solution for Nebulization, 3 mL, Nebulization, Q4H PRN  ondansetron (ZOFRAN) 2 mg/mL injection, 4 mg, Intravenous, Q6H PRN  pantoprazole (PROTONIX) delayed release tablet, 40 mg, Oral, Daily  sertraline (ZOLOFT) tablet, 100 mg, Oral, Daily        Physical Exam:    Gen: NAD, pleasant, alert/oriented x 3, speaking in full sentences, husband present  HEENT:  Atraumatic, moist mucous membranes  Neck: supple, no TM  Heart:  Normal S1/S2, no extra heart sounds noted  Chest:  Clear to auscultation bilaterally, good aeration  to bases  Abdomen:+BS, soft,NT/ND, no rebound/guarding, normal contour  Extr: no c/c/e  Neurological:  Moves extremities equally  Skin: Skin is warm and dry, no erythema noted    Labs:  Results for orders placed or performed during the hospital encounter of 06/28/23 (from the past 24 hour(s))   PTT (PARTIAL THROMBOPLASTIN TIME)   Result Value Ref Range    APTT 47.4 (H) 25.0 - 38.0 seconds   PTT (PARTIAL THROMBOPLASTIN TIME)   Result Value Ref Range    APTT 48.5 (H) 25.0 - 38.0 seconds   PT/INR - (Please order if pt is also on Warfarin)   Result Value Ref Range    PROTHROMBIN TIME 16.2 (H) 9.8 - 12.7 seconds    INR 1.38 (H) 0.84 - 1.10    Narrative    INR OF 2.0-3.0  RECOMMENDED FOR: PROPHYLAXIS/TREATMENT OF VENEOUS THROMBOSIS, PULMONARY EMBOLISM, PREVENTION OF SYSTEMIC EMBOLISM FROM ATRIAL FIBRILATION, MYOCARDIAL INFARCTION.    INR OF 2.5-3.5  RECOMMENDED FOR MECHANICAL PROSTHETIC HEART VALVES, RECURRENT SYSTEMIC EMBOLISM, RECURRENT MYOCARDIAL INFARCTION.     CBC/DIFF    Narrative    The following orders were created for panel order CBC/DIFF.  Procedure  Abnormality         Status                     ---------                               -----------         ------                     CBC WITH KGMW[[id number]]                Abnormal            Final result                 Please view results for these tests on the individual orders.   COMPREHENSIVE METABOLIC PANEL, NON-FASTING   Result Value Ref Range    SODIUM 134 (L) 136 - 145 mmol/L    POTASSIUM 3.5 3.5 - 5.1 mmol/L    CHLORIDE 102 98 - 107 mmol/L    CO2 TOTAL 21 21 - 31 mmol/L    ANION GAP 11 4 - 13 mmol/L    BUN 11 7 - 25 mg/dL    CREATININE 4.40 3.47 - 1.30 mg/dL    BUN/CREA RATIO 16 6 - 22    ESTIMATED GFR 104 >59 mL/min/1.51m^2    ALBUMIN 4.3 3.5 - 5.7 g/dL    CALCIUM 8.8 8.6 - 42.5 mg/dL    GLUCOSE 956 (H) 74 - 109 mg/dL    ALKALINE PHOSPHATASE 87 34 - 104 U/L    ALT (SGPT) 26 7 - 52 U/L    AST (SGOT) 16 13 - 39 U/L     BILIRUBIN TOTAL 0.9 0.3 - 1.0 mg/dL    PROTEIN TOTAL 7.4 6.4 - 8.9 g/dL    ALBUMIN/GLOBULIN RATIO 1.4 0.8 - 1.4    OSMOLALITY, CALCULATED 270 270 - 290 mOsm/kg    CALCIUM, CORRECTED 8.6 (L) 8.9 - 10.8 mg/dL    GLOBULIN 3.1 2.9 - 5.4    Narrative    Estimated Glomerular Filtration Rate (eGFR) is calculated using the CKD-EPI (2021) equation, intended for patients 3 years of age and older. If gender is not documented or "unknown", there will be no eGFR calculation.     MAGNESIUM   Result Value Ref Range    MAGNESIUM 2.1 1.9 - 2.7 mg/dL   PTT (PARTIAL THROMBOPLASTIN TIME)   Result Value Ref Range    APTT 50.8 (H) 25.0 - 38.0 seconds   CBC WITH DIFF   Result Value Ref Range    WBC 14.0 (H) 3.8 - 11.8 x10^3/uL    RBC 4.33 3.63 - 4.92 x10^6/uL    HGB 12.6 10.9 - 14.3 g/dL    HCT 38.7 56.4 - 33.2 %    MCV 84.3 75.5 - 95.3 fL    MCH 29.0 24.7 - 32.8 pg    MCHC 34.4 32.3 - 35.6 g/dL    RDW 95.1 88.4 - 16.6 %    PLATELETS 246 140 - 440 x10^3/uL    MPV 6.9 (L) 7.9 - 10.8 fL    NEUTROPHIL % 77 43 - 77 %    LYMPHOCYTE % 15 (L) 16 - 44 %    MONOCYTE % 6 5 - 13 %    EOSINOPHIL % 1 1 - 7 %    BASOPHIL % 1 0 - 1 %  NEUTROPHIL # 10.80 (H) 1.85 - 7.80 x10^3/uL    LYMPHOCYTE # 2.10 1.00 - 3.00 x10^3/uL    MONOCYTE # 0.90 0.30 - 1.00 x10^3/uL    EOSINOPHIL # 0.10 0.00 - 0.50 x10^3/uL    BASOPHIL # 0.10 0.00 - 0.10 x10^3/uL   PTT (PARTIAL THROMBOPLASTIN TIME)   Result Value Ref Range    APTT 43.6 (H) 25.0 - 38.0 seconds        Imaging:    Results for orders placed or performed during the hospital encounter of 06/28/23   CT ANGIO CHEST FOR PULMONARY EMBOLUS W IV CONTRAST     Status: None    Narrative    Aesha Terra    RADIOLOGIST: Wynema Birch, MD    CT ANGIO CHEST FOR PULMONARY EMBOLUS W IV CONTRAST performed on 06/28/2023 9:45 AM    CLINICAL HISTORY: new dvt w/ new chest pain and shob.  new dvt w/ new chest pain and sob    TECHNIQUE: CTA imaging of the chest with intravenous contrast.  3D reconstructions.  CONTRAST:  100 ml's of  Omnipaque 350    COMPARISON: None.         FINDINGS:  Hardware:  None.    Lymph nodes:   No mediastinal, hilar, or axillary lymphadenopathy.    Heart:  Normal heart size.  No pericardial effusion.        RV/LV Diameter Ratio: 0.81    Thoracic Aorta:  No thoracic aortic aneurysm or dissection.    Pulmonary Vessels:  There is a pulmonary embolism in the right lower lobe branch involving a subsegmental branch.     Most Proximal Level Of Embolus (if embolus present): Subsegmental    Lungs and Airways:  There is some atelectasis dependently.    Pleura: No pleural effusion.  No pneumothorax.    Upper Abdomen: There is fatty infiltration of the liver.    Bones: There are mild degenerative changes of the thoracic spine.        Impression    THERE IS A SINGLE RIGHT LOWER LOBE PULMONARY EMBOLISM. THE VENTRICULAR RATIO IS 0.81.    Findings of the pulmonary embolism were discussed with Dr. Katrinka Blazing at 9:58 AM on 06/28/2023.      One or more dose reduction techniques were used (e.g., Automated exposure control, adjustment of the mA and/or kV according to patient size, use of iterative reconstruction technique).      Radiologist location ID: [id number]           Microbiology:  No results found for any visits on 06/28/23 (from the past 96 hour(s)).       Assessment/ Plan:   Acute pulmonary embolism  Unprovoked deep vein thrombosis  -CTA shows right lower lobe branch pulmonary embolism with subsegmental involvement  -IV anticoagulation Rx to continue  -2D echo unremarkable for evidence of RV strain  -continue with telemetry monitoring  -Hematology/oncology consult to follow     Depression/Anxiety   Mood Disorder  -Outpatient regimen to continue   -Stable; no active issues     GERD  -PPI Rx to continue     Deconditioned State  -PT as per schedule  -out of bed to chair encouraged     Code Status:  Full code    Diet:  Regular    DVT Prophylaxis   -SCDs/Lovenox    Disposition:  Pending clinical course    I have spent 55 minutes care for  this patient with over half in discussion of the  diagnosis and the importance of compliance with the treatment plan.  The remainder of the time was spent examining the patient and discussing the plan of care patient's family/care team    Lalla Brothers, MD    This note was partially generated using MModal Fluency Direct system, and there may be some incorrect words, spellings, and punctuation that were not noted in checking the note before saving.

## 2023-06-30 DIAGNOSIS — I824Z2 Acute embolism and thrombosis of unspecified deep veins of left distal lower extremity: Secondary | ICD-10-CM

## 2023-06-30 DIAGNOSIS — I82432 Acute embolism and thrombosis of left popliteal vein: Secondary | ICD-10-CM

## 2023-06-30 DIAGNOSIS — R509 Fever, unspecified: Secondary | ICD-10-CM

## 2023-06-30 DIAGNOSIS — I82412 Acute embolism and thrombosis of left femoral vein: Secondary | ICD-10-CM

## 2023-06-30 DIAGNOSIS — I82452 Acute embolism and thrombosis of left peroneal vein: Secondary | ICD-10-CM

## 2023-06-30 DIAGNOSIS — I82442 Acute embolism and thrombosis of left tibial vein: Secondary | ICD-10-CM

## 2023-06-30 DIAGNOSIS — M79662 Pain in left lower leg: Secondary | ICD-10-CM

## 2023-06-30 DIAGNOSIS — Z87891 Personal history of nicotine dependence: Secondary | ICD-10-CM

## 2023-06-30 LAB — URINALYSIS, MACROSCOPIC
BILIRUBIN: NEGATIVE mg/dL
BLOOD: 0.03 mg/dL
GLUCOSE: NEGATIVE mg/dL
KETONES: NEGATIVE mg/dL
LEUKOCYTES: 500 WBCs/uL — AB
NITRITE: NEGATIVE
PH: 6 (ref 5.0–9.0)
PROTEIN: NEGATIVE mg/dL
SPECIFIC GRAVITY: 1.009 (ref 1.002–1.030)
UROBILINOGEN: 3 mg/dL — AB

## 2023-06-30 LAB — LIPID PANEL
CHOL/HDL RATIO: 3.6
CHOLESTEROL: 161 mg/dL (ref <200)
HDL CHOL: 45 mg/dL (ref >=40)
LDL CALC: 77 mg/dL (ref 0–100)
TRIGLYCERIDES: 193 mg/dL — ABNORMAL HIGH (ref <=150)
VLDL CALC: 39 mg/dL (ref 0–50)

## 2023-06-30 LAB — COMPREHENSIVE METABOLIC PANEL, NON-FASTING
ALBUMIN/GLOBULIN RATIO: 1 (ref 0.8–1.4)
ALBUMIN: 3.6 g/dL (ref 3.5–5.7)
ALKALINE PHOSPHATASE: 102 U/L (ref 34–104)
ALT (SGPT): 53 U/L — ABNORMAL HIGH (ref 7–52)
ANION GAP: 8 mmol/L (ref 4–13)
AST (SGOT): 35 U/L (ref 13–39)
BILIRUBIN TOTAL: 0.8 mg/dL (ref 0.3–1.0)
BUN/CREA RATIO: 14 (ref 6–22)
BUN: 9 mg/dL (ref 7–25)
CALCIUM, CORRECTED: 8.9 mg/dL (ref 8.9–10.8)
CALCIUM: 8.6 mg/dL (ref 8.6–10.3)
CHLORIDE: 101 mmol/L (ref 98–107)
CO2 TOTAL: 26 mmol/L (ref 21–31)
CREATININE: 0.66 mg/dL (ref 0.60–1.30)
ESTIMATED GFR: 105 mL/min/{1.73_m2} (ref >59)
GLOBULIN: 3.5 (ref 2.9–5.4)
GLUCOSE: 126 mg/dL — ABNORMAL HIGH (ref 74–109)
OSMOLALITY, CALCULATED: 270 mOsm/kg (ref 270–290)
POTASSIUM: 3.6 mmol/L (ref 3.5–5.1)
PROTEIN TOTAL: 7.1 g/dL (ref 6.4–8.9)
SODIUM: 135 mmol/L — ABNORMAL LOW (ref 136–145)

## 2023-06-30 LAB — CBC WITH DIFF
BASOPHIL #: 0.1 10*3/uL (ref 0.00–0.10)
BASOPHIL %: 1 % (ref 0–1)
EOSINOPHIL #: 0.2 10*3/uL (ref 0.00–0.50)
EOSINOPHIL %: 1 % (ref 1–7)
HCT: 35.1 % (ref 31.2–41.9)
HGB: 12.1 g/dL (ref 10.9–14.3)
LYMPHOCYTE #: 1.6 10*3/uL (ref 1.00–3.00)
LYMPHOCYTE %: 14 % — ABNORMAL LOW (ref 16–44)
MCH: 28.9 pg (ref 24.7–32.8)
MCHC: 34.5 g/dL (ref 32.3–35.6)
MCV: 83.9 fL (ref 75.5–95.3)
MONOCYTE #: 0.9 10*3/uL (ref 0.30–1.00)
MONOCYTE %: 8 % (ref 5–13)
MPV: 7.2 fL — ABNORMAL LOW (ref 7.9–10.8)
NEUTROPHIL #: 8.3 10*3/uL — ABNORMAL HIGH (ref 1.85–7.80)
NEUTROPHIL %: 76 % (ref 43–77)
PLATELETS: 269 10*3/uL (ref 140–440)
RBC: 4.19 10*6/uL (ref 3.63–4.92)
RDW: 13.6 % (ref 12.3–17.7)
WBC: 11 10*3/uL (ref 3.8–11.8)

## 2023-06-30 LAB — PT/INR
INR: 1.21 — ABNORMAL HIGH (ref 0.84–1.10)
PROTHROMBIN TIME: 14.2 seconds — ABNORMAL HIGH (ref 9.8–12.7)

## 2023-06-30 LAB — URINALYSIS, MICROSCOPIC
NON-SQUAMOUS EPITHELIAL CELLS URINE: 1 /hpf (ref <=1)
RBCS: 3 /hpf (ref <4)
SQUAMOUS EPITHELIAL: 1 /hpf (ref <28)
WBCS: 39 /hpf — ABNORMAL HIGH (ref <6)

## 2023-06-30 LAB — PTT (PARTIAL THROMBOPLASTIN TIME): APTT: 42 seconds — ABNORMAL HIGH (ref 25.0–38.0)

## 2023-06-30 LAB — MAGNESIUM: MAGNESIUM: 2.1 mg/dL (ref 1.9–2.7)

## 2023-06-30 LAB — POC BLOOD GLUCOSE (RESULTS): GLUCOSE, POC: 123 mg/dl — ABNORMAL HIGH (ref 70–100)

## 2023-06-30 MED ORDER — APIXABAN 5 MG TABLET
5.0000 mg | ORAL_TABLET | Freq: Two times a day (BID) | ORAL | Status: DC
Start: 2023-07-07 — End: 2023-06-30

## 2023-06-30 MED ORDER — APIXABAN 5 MG TABLET
10.0000 mg | ORAL_TABLET | Freq: Two times a day (BID) | ORAL | Status: DC
Start: 2023-06-30 — End: 2023-07-02
  Administered 2023-06-30 – 2023-07-02 (×5): 10 mg via ORAL
  Filled 2023-06-30 (×5): qty 2

## 2023-06-30 MED ORDER — CIPROFLOXACIN 500 MG TABLET
500.0000 mg | ORAL_TABLET | Freq: Two times a day (BID) | ORAL | Status: DC
Start: 2023-06-30 — End: 2023-07-02
  Administered 2023-06-30 – 2023-07-02 (×6): 500 mg via ORAL
  Filled 2023-06-30 (×6): qty 1

## 2023-06-30 NOTE — Consults (Signed)
Department of Hematology/Oncology   ONCOLOGY CONSULT NOTE      REFERRING PROVIDER:  No referring provider defined for this encounter.        Reason for Consult:  Unprovoked deep vein thrombosis PE  PCP:    HISTORY OF PRESENT ILLNESS:  Konnie Kneedler is a 52 y.o. female who presented to the hospital for evaluation of "lower left chest pain".  Patient was seen in the ER 06/27/2023 for low back pain that radiated down her left leg.  Pain was worse in her left calf.  Peripheral venous duplex showed a left lower extremity deep vein thrombosis in the left common femoral vein, femoral vein, popliteal vein, peroneal vein and posterior tibial vein.  Patient was discharged with Eliquis 10 mg b.i.d. for seven days and then decrease to one tablet b.i.d..  On 06/28/2023 patient presented to the ER with left lower chest pain and shortness of breath.  CTA of the chest showed right lower lobe pulmonary embolism involving the segmental branch.  Patient was admitted and started on heparin drip.  Hematology/oncology consultation was requested for evaluation of unprovoked deep vein thrombosis/PE.    Patient denies any recent travel, smoking, prolonged immobility, recent illness, OCP or hormone use, trauma and is unaware of any family history of blood clots.  Patient reports one week prior to diagnosis she was noticing some pain with walking in her lower calves and having swelling that would decrease with elevation.  She reports that her last mammogram was approximately two years ago and was normal.  She does not complete self-breast exams.  Unsure of last colonoscopy the patient does report issues with IBS.  She denies any blood in her stools.  Patient reports that in May of 2024 she passed out in her bathroom and hit her head neck on a metal trash can.  She reports that she got up and had washed her hands and was starting to leave the restroom when she passed out.  Clinical history from that ER visit states that she stood up from  the toilet became dizzy and then fell.  Husband reports that after this syncopal episode she was only mumbling and was out of it.  She was seen in the ER and CT of the brain was normal.  Since that time she has had issues with her left shoulder and left side of her body she is currently seeing an orthopedist for these issues.  She has also had issues with what is believed to be vertigo since June or July of this year.  She does report issues with headaches that are in the frontal area that she states were cluster headaches.  These used to her at least weekly but she notices a decrease in those presently.    Patient is unknown to [facility].     Pertinent admission diagnostics:  06/27/2023 deep vein thrombosis in the left common femoral vein, femoral vein, popliteal vein, peroneal vein, and posterior tibial vein.  06/28/2023 single right lower lobe pulmonary embolism involving a subsegmental branch  06/29/2023   Ran intermittent fever, Four plex negative, + UA and was started on Cipro    SUBJECTIVE:  Patient lying in bed with eyes closed.  Awakes easily to name.  She denies any current complaints or issues.  Still has swelling in her left leg.  Denies any issues with Eliquis at this time.  Patient reports that she has not ever had a colonoscopy.  She also reports that her last Pap  was greater than two years.    ROS:   Consitutional: energy level good, appetite good, no weight loss, no fever, no chills  HEENT: No epistaxis, no dysphagia, no gum bleeding  Cardiovascular: no chest pain, no palpitations, no orthopnea  Respiratory: No cough, no hemoptysis, no dyspnea, no pleurisy,m no wheezing  Gastrointestinal: No nausea, no vomiting, no hematemesis, no diarrhea, no constipation, no melena  Musculoskeletal: No swollen joints, no joint redness  Skin: No Rash, No nodules, no pruritus, no lesions  Neurologic: No seizures, no headache, no paresthesia  Psychiatric: no depression, no anxiety  Endocrine:  no polyuria, no polydipsia    HISTORY:  Past Medical History:   Diagnosis Date    Depression     DVT (deep venous thrombosis) (CMS HCC)          Past Surgical History:   Procedure Laterality Date    HX TONSILLECTOMY      HX TUBAL LIGATION           Social History     Socioeconomic History    Marital status: Married     Spouse name: Not on file    Number of children: Not on file    Years of education: Not on file    Highest education level: Not on file   Occupational History    Not on file   Tobacco Use    Smoking status: Former     Types: Cigarettes    Smokeless tobacco: Never   Vaping Use    Vaping status: Never Used   Substance and Sexual Activity    Alcohol use: Not Currently    Drug use: Never    Sexual activity: Not on file   Other Topics Concern    Not on file   Social History Narrative    Not on file     Social Determinants of Health     Financial Resource Strain: Not on file   Transportation Needs: Low Risk  (06/28/2023)    Transportation Needs     SDOH Transportation: No   Social Connections: Low Risk  (06/28/2023)    Social Connections     SDOH Social Isolation: 5 or more times a week   Intimate Partner Violence: Not on file   Housing Stability: Not on file     Family Medical History:    None         No outpatient medications have been marked as taking for the 06/28/23 encounter [facility]).      Allergies   Allergen Reactions    Bactrim [Sulfamethoxazole-Trimethoprim] Rash    Cephalexin        PHYSICAL EXAM:    BP 111/70   Pulse 89   Temp 37.1 C (98.8 F)   Resp 18   Ht 1.651 m (5\' 5" )   Wt 72.6 kg (160 lb 0.9 oz)   SpO2 94%   BMI 26.63 kg/m          ECOG Status: (0) Fully active, able to carry on all predisease performance without restriction      General: Awake, alert, NAD  HEENT: Head normocephalic, atraumatic.  Mucouse membranes moist.  PERRLA, no pallor, no icterus  Adenopathy: No palpable cervical, axillary, or inguinal adenopathy noted  Neck: No JVD, Supple, no adenopathy, No  JVD  Lungs: Clear to auscultation bilaterally.  No rales, rhonchi, no wheezing  Breast exam:  Right and left breast exam completed without evidence of disease, mass or skin lesions.  Cardiovascular:  Regular rate and rhythm, No murmur, no friction rub  Abdomen: Soft, Symmetric, No tenderness, No hepatosplenomegaly, BSA x4  Extremities: + left lower extremity peripheral edema. Warm. No calf tenderness  Skin: Skin warm and dry.  No suspicious lesions  Neurologic: Alert and oriented x3. Cranial nerves II through XII intact. No focal paralysis or asterixis  Psych: Mood and affect congruent for age and gender, Denies SI/HI       DIAGNOSTIC DATA:  Results for orders placed during the hospital encounter of 06/27/23    PERIPHERAL VENOUS DUPLEX - LOWER    Interpretation Summary  Dion Kasik    RADIOLOGIST: Murray Hodgkins    EXAMINATION: PERIPHERAL VENOUS DUPLEX - LOWER    EXAM DATE/TIME:  06/27/2023 10:54 AM    CLINICAL INDICATION: M79.606: Leg pain    COMPARISON: None.    FINDINGS    There is deep venous thrombosis seen within the left common femoral vein, femoral vein, popliteal vein, peroneal vein, and posterior tibial vein. There is normal flow within the left anterior tibial vein. The right common femoral vein showed flow.    Impression  Left lower extremity DVT discussed above.    A Significant [place] actionable finding has been sent via the Power[employer] Actionable Findings application on 06/27/2023 11:07 AM, Message ID [id number]. Receipt of this communication will be communicated to [place] [facility] or responsible provider and will be documented in Power[employer] Actionable Findings System upon receiving the acknowledgement.      Radiologist location ID: [id number]  Results for orders placed during the hospital encounter of 06/28/23    CT ANGIO CHEST FOR PULMONARY EMBOLUS W IV CONTRAST    Narrative  Crystol Hoffman    RADIOLOGIST: Wynema Birch, MD    CT ANGIO CHEST FOR PULMONARY EMBOLUS W IV CONTRAST performed on  06/28/2023 9:45 AM    CLINICAL HISTORY: new dvt w/ new chest pain and shob.  new dvt w/ new chest pain and sob    TECHNIQUE: CTA imaging of the chest with intravenous contrast.  3D reconstructions.  CONTRAST:  100 ml's of Omnipaque 350    COMPARISON: None.    FINDINGS:  Hardware:  None.    Lymph nodes:   No mediastinal, hilar, or axillary lymphadenopathy.    Heart:  Normal heart size.  No pericardial effusion.  RV/LV Diameter Ratio: 0.81    Thoracic Aorta:  No thoracic aortic aneurysm or dissection.    Pulmonary Vessels:  There is a pulmonary embolism in the right lower lobe branch involving a subsegmental branch.  Most Proximal Level Of Embolus (if embolus present): Subsegmental    Lungs and Airways:  There is some atelectasis dependently.    Pleura: No pleural effusion.  No pneumothorax.    Upper Abdomen: There is fatty infiltration of the liver.    Bones: There are mild degenerative changes of the thoracic spine.    Impression  THERE IS A SINGLE RIGHT LOWER LOBE PULMONARY EMBOLISM. THE VENTRICULAR RATIO IS 0.81.    Findings of the pulmonary embolism were discussed with Dr. Katrinka Blazing at 9:58 AM on 06/28/2023.      One or more dose reduction techniques were used (e.g., Automated exposure control, adjustment of the mA and/or kV according to patient size, use of iterative reconstruction technique).      Radiologist location ID: [id number]      LABS:     Basic Metabolic Profile    Lab Results   Component Value Date/Time    SODIUM 135 (L)  06/30/2023 05:27 AM    POTASSIUM 3.6 06/30/2023 05:27 AM    CHLORIDE 101 06/30/2023 05:27 AM    CO2 26 06/30/2023 05:27 AM    ANIONGAP 8 06/30/2023 05:27 AM    Lab Results   Component Value Date/Time    BUN 9 06/30/2023 05:27 AM    CREATININE 0.66 06/30/2023 05:27 AM    GLUCOSENF 126 (H) 06/30/2023 05:27 AM          CBC  Diff   Lab Results   Component Value Date/Time    WBC 11.0 06/30/2023 05:27 AM    HGB 12.1 06/30/2023 05:27 AM    HCT 35.1 06/30/2023 05:27 AM    PLTCNT 269 06/30/2023  05:27 AM    RBC 4.19 06/30/2023 05:27 AM    MCV 83.9 06/30/2023 05:27 AM    MCHC 34.5 06/30/2023 05:27 AM    MCH 28.9 06/30/2023 05:27 AM    RDW 13.6 06/30/2023 05:27 AM    MPV 7.2 (L) 06/30/2023 05:27 AM    Lab Results   Component Value Date/Time    PMNS 76 06/30/2023 05:27 AM    LYMPHOCYTES 14 (L) 06/30/2023 05:27 AM    EOSINOPHIL 1 06/30/2023 05:27 AM    MONOCYTES 8 06/30/2023 05:27 AM    BASOPHILS 1 06/30/2023 05:27 AM    BASOPHILS 0.10 06/30/2023 05:27 AM    PMNABS 8.30 (H) 06/30/2023 05:27 AM    LYMPHSABS 1.60 06/30/2023 05:27 AM    EOSABS 0.20 06/30/2023 05:27 AM    MONOSABS 0.90 06/30/2023 05:27 AM            ASSESSMENT:    ICD-10-CM    1. Other acute pulmonary embolism without acute cor pulmonale (CMS HCC)  I26.99       2. Cervical radiculopathy  M54.12       3. Acute deep vein thrombosis of proximal leg, left (CMS HCC)  I82.4Y2       4. Acute deep vein thrombosis of lower leg, left (CMS HCC)  I82.4Z2       5. Syncope  R55 TRANSTHORACIC ECHOCARDIOGRAM - ADULT        PE/left Leg DVT:  Unprovoked.    Hypercoagulable testing ordered.   Not considered a failure on Eliquis, due to <24 hours on medication  Heparin drip started and then changed to Eliquis given with loading dose.        PLAN:   1.)Continue Eliquis 10 mg PO BID x 7 days and then decrease to 5 mg PO BID.       2.) Patient will need a minimum of 3 months of anticoagulation following discharge.  Will need follow up Left leg venous duplex for resolution of clot before stopping Eliquis.   3.) Patient educated to be diligent about monitoring for abnormal bleeding including but not limited epistaxis, hemoptysis, bright red rectal bleeding, black tarry stools, hematuria or any other abnormal bleeding. Also advised to be careful with injuries including but not limited to head injuries with or with out loss of consciousness should be evaluated to ensure no abnormal bleeding, patient verbalized understanding.   4.)  Recommended patient get outpatient  mammogram, colonoscopy and Pap smear due to her unprovoked deep vein thrombosis/PE.  Patient has stated understanding of this.  5.) Following discharge patient will follow up with outpatient [facility] [place] in 4-6 weeks  6.) Supportive care.    Thank you for this consultation and allowing [place] to participate in the care of this patient.  Tamzyn Vanausdal was given the chance to ask questions, and these were answered to their satisfaction. The patient is welcome to call with any questions or concerns in the meantime.     Thank you for this consultation and allowing [place] to participate in this patients care.    Patient seen independently today, Case was discussed with Primary hematologist/oncologist Dr. Melynda Keller, The above plan was advised, ordered, and implemented.     Marvene Staff APRN, FNP-BC, AOCNP, 06/30/2023 , 08:08       CC:  [facility]  [address]  [place] [address]    No referring provider defined for this encounter.

## 2023-06-30 NOTE — Progress Notes (Signed)
Patient has had intermittent fever over the last 24 hrs with Tmax 102.4 F. Respiratory 4-plex negative. Patient complains straining and difficulty with urination. Urinalysis positive for 500 leukocyte esterase and 39 WBC's. Allergy documented to cephalexin. Started PO ciprofloxacin, to follow urine culture results.     Collier Salina, PA-C

## 2023-06-30 NOTE — Care Plan (Signed)
POC reviewed with pt. Pt agrees to POC including medications and monitoring. Pt denies further needs.   Problem: Adult Inpatient Plan of Care  Goal: Plan of Care Review  Outcome: Ongoing (see interventions/notes)     Problem: Adult Inpatient Plan of Care  Goal: Patient-Specific Goal (Individualized)  Outcome: Ongoing (see interventions/notes)  Flowsheets (Taken 06/30/2023 0800)  Individualized Care Needs: assist with adl's  Anxieties, Fears or Concerns: Anxius about dvt  Patient-Specific Goals (Include Timeframe): discharge to home

## 2023-06-30 NOTE — Progress Notes (Signed)
[facility]   Progress Note    Terri Bautista  Date of service: 06/30/2023  Date of Admission:  06/28/2023  Hospital Day:  LOS: 2 days     Subjective:  The patient was noted to have had an elevated fever and a urinalysis obtained at times consistent with an acute infectious process.  She was started on broad-spectrum antibiotic Rx and denies any additional somatic complaints at the time of the evaluation.    The results of the most recent laboratory studies were discussed with the patient and her husband, Terri Bautista who was present at the bedside at the time of the evaluation.    Out of bed to chair encouraged.    Review of Systems:    As mentioned above.     Objective:      Vital Signs:  Vitals:    06/29/23 2203 06/29/23 2346 06/30/23 0854 06/30/23 0942   BP:  111/70 109/69    Pulse: 100 89 95    Resp:  18 18 17    Temp:  37.1 C (98.8 F) 36.7 C (98.1 F)    SpO2:  94% 94%    Weight:       Height:       BMI:         I/O:  I/O last 24 hours:    Intake/Output Summary (Last 24 hours) at 06/30/2023 1116  Last data filed at 06/30/2023 1047  Gross per 24 hour   Intake 600 ml   Output --   Net 600 ml     acetaminophen (TYLENOL) tablet, 650 mg, Oral, Q4H PRN  [Held by provider] apixaban (ELIQUIS) tablet, 10 mg, Oral, 2x/day  ciprofloxacin (CIPRO) tablet, 500 mg, Oral, 2x/day  heparin 25,000 units in D5W 250 mL infusion, 18 Units/kg/hr (Adjusted), Intravenous, Continuous  HYDROcodone-acetaminophen (NORCO) 5-325 mg per tablet, 1 Tablet, Oral, Q4H PRN  ipratropium-albuterol 0.5 mg-3 mg(2.5 mg base)/3 mL Solution for Nebulization, 3 mL, Nebulization, Q4H PRN  ondansetron (ZOFRAN) 2 mg/mL injection, 4 mg, Intravenous, Q6H PRN  pantoprazole (PROTONIX) delayed release tablet, 40 mg, Oral, Daily  sertraline (ZOLOFT) tablet, 100 mg, Oral, Daily        Physical Exam:    Gen: NAD, pleasant, alert/oriented x 3, speaking in full sentences, husband present  HEENT:  Atraumatic, moist mucous membranes  Neck: supple, no  TM  Heart:  Normal S1/S2, no extra heart sounds noted  Chest:  Clear to auscultation bilaterally, good aeration to bases  Abdomen:+BS, soft,NT/ND, no rebound/guarding, normal contour  Extr: no c/c/e  Neurological:  Moves extremities equally  Skin: Skin is warm and dry, no erythema noted    Labs:  Results for orders placed or performed during the hospital encounter of 06/28/23 (from the past 24 hour(s))   COVID-19, FLU A/B, RSV RAPID BY PCR   Result Value Ref Range    SARS-CoV-2 Not Detected Not Detected    INFLUENZA VIRUS TYPE A Not Detected Not Detected    INFLUENZA VIRUS TYPE B Not Detected Not Detected    RESPIRATORY SYNCTIAL VIRUS (RSV) Not Detected Not Detected    Narrative    Results are for the simultaneous qualitative identification of SARS-CoV-2 (formerly 2019-nCoV), Influenza A, Influenza B, and RSV RNA. These etiologic agents are generally detectable in nasopharyngeal and nasal swabs during the ACUTE PHASE of infection. Hence, this test is intended to be performed on respiratory specimens collected from individuals with signs and symptoms of upper respiratory tract infection who meet Centers for Disease  Control and Prevention (CDC) clinical and/or epidemiological criteria for Coronavirus Disease 2019 (COVID-19) testing. CDC COVID-19 criteria for testing on human specimens is available at [facility] webpage information for Healthcare Professionals: Coronavirus Disease 2019 (COVID-19) ([email or web address]).     False-negative results may occur if the virus has genomic mutations, insertions, deletions, or rearrangements or if performed very early in the course of illness. Otherwise, negative results indicate virus specific RNA targets are not detected, however negative results do not preclude SARS-CoV-2 infection/COVID-19, Influenza, or Respiratory syncytial virus infection. Results should not be used as the sole basis for patient management decisions. Negative results must be  combined with clinical observations, patient history, and epidemiological information. If upper respiratory tract infection is still suspected based on exposure history together with other clinical findings, re-testing should be considered.    Test methodology:   Cepheid Xpert Xpress SARS-CoV-2/Flu/RSV Assay real-time polymerase chain reaction (RT-PCR) test on the GeneXpert Dx and Xpert Xpress systems.   URINALYSIS, MACROSCOPIC AND MICROSCOPIC W/CULTURE REFLEX [PRN ONLY]    Specimen: Urine, Clean Catch    Narrative    The following orders were created for panel order URINALYSIS, MACROSCOPIC AND MICROSCOPIC W/CULTURE REFLEX [PRN ONLY].  Procedure                               Abnormality         Status                     ---------                               -----------         ------                     URINALYSIS, MACROSCOPIC[[id number]]      Abnormal            Final result               URINALYSIS, MICROSCOPIC[[id number]]      Abnormal            Final result                 Please view results for these tests on the individual orders.   URINALYSIS, MACROSCOPIC   Result Value Ref Range    COLOR Light Yellow Colorless, Light Yellow, Yellow    APPEARANCE Clear Clear    SPECIFIC GRAVITY 1.009 1.002 - 1.030    PH 6.0 5.0 - 9.0    LEUKOCYTES 500 (A) Negative, 100  WBCs/uL    NITRITE Negative Negative    PROTEIN Negative Negative, 10 , 20  mg/dL    GLUCOSE Negative Negative, 30  mg/dL    KETONES Negative Negative, Trace mg/dL    BILIRUBIN Negative Negative, 0.5 mg/dL    BLOOD 4.78 Negative, 0.03 mg/dL    UROBILINOGEN 3 (A) Normal mg/dL   URINALYSIS, MICROSCOPIC   Result Value Ref Range    RBCS 3 <4 /hpf    WBCS 39 (H) <6 /hpf    SQUAMOUS EPITHELIAL 1 <28 /hpf    NON-SQUAMOUS EPITHELIAL CELLS URINE 1 <=1 /hpf   PT/INR - (Please order if pt is also on Warfarin)   Result Value Ref Range    PROTHROMBIN TIME 14.2 (H) 9.8 - 12.7 seconds    INR 1.21 (H) 0.84 - 1.10  Narrative    INR OF 2.0-3.0  RECOMMENDED FOR:  PROPHYLAXIS/TREATMENT OF VENEOUS THROMBOSIS, PULMONARY EMBOLISM, PREVENTION OF SYSTEMIC EMBOLISM FROM ATRIAL FIBRILATION, MYOCARDIAL INFARCTION.    INR OF 2.5-3.5  RECOMMENDED FOR MECHANICAL PROSTHETIC HEART VALVES, RECURRENT SYSTEMIC EMBOLISM, RECURRENT MYOCARDIAL INFARCTION.     CBC/DIFF    Narrative    The following orders were created for panel order CBC/DIFF.  Procedure                               Abnormality         Status                     ---------                               -----------         ------                     CBC WITH IOEV[[id number]]                Abnormal            Final result                 Please view results for these tests on the individual orders.   COMPREHENSIVE METABOLIC PANEL, NON-FASTING   Result Value Ref Range    SODIUM 135 (L) 136 - 145 mmol/L    POTASSIUM 3.6 3.5 - 5.1 mmol/L    CHLORIDE 101 98 - 107 mmol/L    CO2 TOTAL 26 21 - 31 mmol/L    ANION GAP 8 4 - 13 mmol/L    BUN 9 7 - 25 mg/dL    CREATININE 8.29 9.37 - 1.30 mg/dL    BUN/CREA RATIO 14 6 - 22    ESTIMATED GFR 105 >59 mL/min/1.55m^2    ALBUMIN 3.6 3.5 - 5.7 g/dL    CALCIUM 8.6 8.6 - 16.9 mg/dL    GLUCOSE 678 (H) 74 - 109 mg/dL    ALKALINE PHOSPHATASE 102 34 - 104 U/L    ALT (SGPT) 53 (H) 7 - 52 U/L    AST (SGOT) 35 13 - 39 U/L    BILIRUBIN TOTAL 0.8 0.3 - 1.0 mg/dL    PROTEIN TOTAL 7.1 6.4 - 8.9 g/dL    ALBUMIN/GLOBULIN RATIO 1.0 0.8 - 1.4    OSMOLALITY, CALCULATED 270 270 - 290 mOsm/kg    CALCIUM, CORRECTED 8.9 8.9 - 10.8 mg/dL    GLOBULIN 3.5 2.9 - 5.4    Narrative    Estimated Glomerular Filtration Rate (eGFR) is calculated using the CKD-EPI (2021) equation, intended for patients 63 years of age and older. If gender is not documented or "unknown", there will be no eGFR calculation.     MAGNESIUM   Result Value Ref Range    MAGNESIUM 2.1 1.9 - 2.7 mg/dL   PTT (PARTIAL THROMBOPLASTIN TIME)   Result Value Ref Range    APTT 42.0 (H) 25.0 - 38.0 seconds   CBC WITH DIFF   Result Value Ref Range    WBC 11.0 3.8 - 11.8  x10^3/uL    RBC 4.19 3.63 - 4.92 x10^6/uL    HGB 12.1 10.9 - 14.3 g/dL    HCT 93.8 10.1 - 75.1 %    MCV 83.9 75.5 - 95.3 fL  MCH 28.9 24.7 - 32.8 pg    MCHC 34.5 32.3 - 35.6 g/dL    RDW 16.1 09.6 - 04.5 %    PLATELETS 269 140 - 440 x10^3/uL    MPV 7.2 (L) 7.9 - 10.8 fL    NEUTROPHIL % 76 43 - 77 %    LYMPHOCYTE % 14 (L) 16 - 44 %    MONOCYTE % 8 5 - 13 %    EOSINOPHIL % 1 1 - 7 %    BASOPHIL % 1 0 - 1 %    NEUTROPHIL # 8.30 (H) 1.85 - 7.80 x10^3/uL    LYMPHOCYTE # 1.60 1.00 - 3.00 x10^3/uL    MONOCYTE # 0.90 0.30 - 1.00 x10^3/uL    EOSINOPHIL # 0.20 0.00 - 0.50 x10^3/uL    BASOPHIL # 0.10 0.00 - 0.10 x10^3/uL        Imaging:    Results for orders placed or performed during the hospital encounter of 06/28/23   CT ANGIO CHEST FOR PULMONARY EMBOLUS W IV CONTRAST     Status: None    Narrative    Kirstie Zulueta    RADIOLOGIST: Wynema Birch, MD    CT ANGIO CHEST FOR PULMONARY EMBOLUS W IV CONTRAST performed on 06/28/2023 9:45 AM    CLINICAL HISTORY: new dvt w/ new chest pain and shob.  new dvt w/ new chest pain and sob    TECHNIQUE: CTA imaging of the chest with intravenous contrast.  3D reconstructions.  CONTRAST:  100 ml's of Omnipaque 350    COMPARISON: None.         FINDINGS:  Hardware:  None.    Lymph nodes:   No mediastinal, hilar, or axillary lymphadenopathy.    Heart:  Normal heart size.  No pericardial effusion.        RV/LV Diameter Ratio: 0.81    Thoracic Aorta:  No thoracic aortic aneurysm or dissection.    Pulmonary Vessels:  There is a pulmonary embolism in the right lower lobe branch involving a subsegmental branch.     Most Proximal Level Of Embolus (if embolus present): Subsegmental    Lungs and Airways:  There is some atelectasis dependently.    Pleura: No pleural effusion.  No pneumothorax.    Upper Abdomen: There is fatty infiltration of the liver.    Bones: There are mild degenerative changes of the thoracic spine.        Impression    THERE IS A SINGLE RIGHT LOWER LOBE PULMONARY EMBOLISM. THE  VENTRICULAR RATIO IS 0.81.    Findings of the pulmonary embolism were discussed with Dr. Katrinka Blazing at 9:58 AM on 06/28/2023.      One or more dose reduction techniques were used (e.g., Automated exposure control, adjustment of the mA and/or kV according to patient size, use of iterative reconstruction technique).      Radiologist location ID: [id number]           Microbiology:  No results found for any visits on 06/28/23 (from the past 96 hour(s)).       Assessment/ Plan:   Acute pulmonary embolism  Unprovoked deep vein thrombosis  -CTA shows right lower lobe branch pulmonary embolism with subsegmental involvement  -IV anticoagulation Rx discontinued; Eliquis Rx initiated  -2D echo unremarkable for evidence of RV strain  -continue with telemetry monitoring  -Hematology/oncology consult appreciated  -outpatient follow up planned    Acute UTI  UTI, POA  -afebrile, HD stable, WBC 14-->11.0  -urinalysis consistent with  acute process  -empiric antibiotic coverage with ciprofloxacin initiated  -follow-up urine Cx and tailor regimen accordingly     Depression/Anxiety   Mood Disorder  -Outpatient regimen to continue   -Stable; no active issues     GERD  -PPI Rx to continue     Deconditioned State  -PT as per schedule  -out of bed to chair encouraged     Code Status:  Full code    Diet:  Regular    DVT Prophylaxis   -SCDs/full anticoagulation    Disposition:  Pending clinical course    I have spent 55 minutes care for this patient with over half in discussion of the diagnosis and the importance of compliance with the treatment plan.  The remainder of the time was spent examining the patient and discussing the plan of care patient's family/care team    Lalla Brothers, MD    This note was partially generated using MModal Fluency Direct system, and there may be some incorrect words, spellings, and punctuation that were not noted in checking the note before saving.

## 2023-07-01 DIAGNOSIS — R5381 Other malaise: Secondary | ICD-10-CM

## 2023-07-01 DIAGNOSIS — F419 Anxiety disorder, unspecified: Secondary | ICD-10-CM

## 2023-07-01 DIAGNOSIS — N390 Urinary tract infection, site not specified: Secondary | ICD-10-CM

## 2023-07-01 DIAGNOSIS — K219 Gastro-esophageal reflux disease without esophagitis: Secondary | ICD-10-CM

## 2023-07-01 DIAGNOSIS — Z7901 Long term (current) use of anticoagulants: Secondary | ICD-10-CM

## 2023-07-01 DIAGNOSIS — F32A Depression, unspecified: Secondary | ICD-10-CM

## 2023-07-01 DIAGNOSIS — I82402 Acute embolism and thrombosis of unspecified deep veins of left lower extremity: Secondary | ICD-10-CM

## 2023-07-01 DIAGNOSIS — I2693 Single subsegmental pulmonary embolism without acute cor pulmonale: Secondary | ICD-10-CM

## 2023-07-01 LAB — CBC WITH DIFF
BASOPHIL #: 0 10*3/uL (ref 0.00–0.10)
BASOPHIL %: 1 % (ref 0–1)
EOSINOPHIL #: 0.2 10*3/uL (ref 0.00–0.50)
EOSINOPHIL %: 2 % (ref 1–7)
HCT: 35.4 % (ref 31.2–41.9)
HGB: 12.2 g/dL (ref 10.9–14.3)
LYMPHOCYTE #: 1.7 10*3/uL (ref 1.00–3.00)
LYMPHOCYTE %: 18 % (ref 16–44)
MCH: 29.3 pg (ref 24.7–32.8)
MCHC: 34.6 g/dL (ref 32.3–35.6)
MCV: 84.7 fL (ref 75.5–95.3)
MONOCYTE #: 0.7 10*3/uL (ref 0.30–1.00)
MONOCYTE %: 8 % (ref 5–13)
MPV: 7.4 fL — ABNORMAL LOW (ref 7.9–10.8)
NEUTROPHIL #: 7 10*3/uL (ref 1.85–7.80)
NEUTROPHIL %: 72 % (ref 43–77)
PLATELETS: 323 10*3/uL (ref 140–440)
RBC: 4.18 10*6/uL (ref 3.63–4.92)
RDW: 13.5 % (ref 12.3–17.7)
WBC: 9.7 10*3/uL (ref 3.8–11.8)

## 2023-07-01 LAB — COMPREHENSIVE METABOLIC PANEL, NON-FASTING
ALBUMIN/GLOBULIN RATIO: 1.1 (ref 0.8–1.4)
ALBUMIN: 3.7 g/dL (ref 3.5–5.7)
ALKALINE PHOSPHATASE: 127 U/L — ABNORMAL HIGH (ref 34–104)
ALT (SGPT): 50 U/L (ref 7–52)
ANION GAP: 7 mmol/L (ref 4–13)
AST (SGOT): 24 U/L (ref 13–39)
BILIRUBIN TOTAL: 1 mg/dL (ref 0.3–1.0)
BUN/CREA RATIO: 13 (ref 6–22)
BUN: 9 mg/dL (ref 7–25)
CALCIUM, CORRECTED: 9 mg/dL (ref 8.9–10.8)
CALCIUM: 8.8 mg/dL (ref 8.6–10.3)
CHLORIDE: 102 mmol/L (ref 98–107)
CO2 TOTAL: 25 mmol/L (ref 21–31)
CREATININE: 0.71 mg/dL (ref 0.60–1.30)
ESTIMATED GFR: 102 mL/min/{1.73_m2} (ref >59)
GLOBULIN: 3.5 (ref 2.9–5.4)
GLUCOSE: 119 mg/dL — ABNORMAL HIGH (ref 74–109)
OSMOLALITY, CALCULATED: 268 mOsm/kg — ABNORMAL LOW (ref 270–290)
POTASSIUM: 3.3 mmol/L — ABNORMAL LOW (ref 3.5–5.1)
PROTEIN TOTAL: 7.2 g/dL (ref 6.4–8.9)
SODIUM: 134 mmol/L — ABNORMAL LOW (ref 136–145)

## 2023-07-01 LAB — MAGNESIUM: MAGNESIUM: 2.2 mg/dL (ref 1.9–2.7)

## 2023-07-01 LAB — PT/INR
INR: 1.9 — ABNORMAL HIGH (ref 0.84–1.10)
PROTHROMBIN TIME: 22.2 seconds — ABNORMAL HIGH (ref 9.8–12.7)

## 2023-07-01 MED ORDER — POTASSIUM CHLORIDE ER 20 MEQ TABLET,EXTENDED RELEASE(PART/CRYST)
40.0000 meq | ORAL_TABLET | ORAL | Status: AC
Start: 2023-07-01 — End: 2023-07-01
  Administered 2023-07-01 (×2): 40 meq via ORAL
  Filled 2023-07-01 (×2): qty 2

## 2023-07-01 MED ORDER — MENTHOL 4 % TOPICAL GEL
1.0000 | Freq: Every day | CUTANEOUS | Status: DC | PRN
  Filled 2023-07-01: qty 5

## 2023-07-01 MED ORDER — LIDOCAINE 4 % TOPICAL PATCH
2.0000 | MEDICATED_PATCH | Freq: Every day | CUTANEOUS | Status: DC
Start: 2023-07-01 — End: 2023-07-02
  Administered 2023-07-01 – 2023-07-02 (×2): 2 via TRANSDERMAL
  Filled 2023-07-01 (×2): qty 2

## 2023-07-01 NOTE — Progress Notes (Signed)
[facility]   Progress Note    Terri Bautista  Date of service: 07/01/2023  Date of Admission:  06/28/2023  Hospital Day:  LOS: 3 days     Subjective:  The patient denies any subjective fevers/chills overnight and further denies any chest pain, palpitations, shortness for breath.    The patient endorsed severe left lower extremity cramping in it was only able to ambulate full assist with the assistance of physical therapy.  The plan is for follow up physical therapy/occupational therapy evaluations later today.    The care plan was reviewed with the patient and her husband, Deniece Portela who was present at the bedside at the time of the evaluation.    Out of bed to chair encouraged.    Review of Systems:    As mentioned above.     Objective:      Vital Signs:  Vitals:    07/01/23 0446 07/01/23 0808 07/01/23 1040 07/01/23 1157   BP:  103/61  101/62   Pulse:  86 91 83   Resp:  16  18   Temp: (P) 37.5 C (99.5 F) 37.4 C (99.3 F)  37.9 C (100.3 F)   SpO2:  94%  96%   Weight:       Height:       BMI:         I/O:  I/O last 24 hours:    Intake/Output Summary (Last 24 hours) at 07/01/2023 1203  Last data filed at 07/01/2023 1101  Gross per 24 hour   Intake 720 ml   Output --   Net 720 ml     acetaminophen (TYLENOL) tablet, 650 mg, Oral, Q4H PRN  apixaban (ELIQUIS) tablet, 10 mg, Oral, 2x/day  ciprofloxacin (CIPRO) tablet, 500 mg, Oral, 2x/day  HYDROcodone-acetaminophen (NORCO) 5-325 mg per tablet, 1 Tablet, Oral, Q4H PRN  ipratropium-albuterol 0.5 mg-3 mg(2.5 mg base)/3 mL Solution for Nebulization, 3 mL, Nebulization, Q4H PRN  lidocaine 4% patch, 2 Patch, Transdermal, Daily  menthol 4% topical gel, 1 Each, Topical, Daily PRN  ondansetron (ZOFRAN) 2 mg/mL injection, 4 mg, Intravenous, Q6H PRN  pantoprazole (PROTONIX) delayed release tablet, 40 mg, Oral, Daily  potassium chloride (K-DUR) extended release tablet, 40 mEq, Oral, Q4H  sertraline (ZOLOFT) tablet, 100 mg, Oral, Daily        Physical Exam:    Gen: NAD,  pleasant, alert/oriented x 3, speaking in full sentences, husband present  HEENT:  Atraumatic, moist mucous membranes  Neck: supple, no TM  Heart:  Normal S1/S2, no extra heart sounds noted  Chest:  Clear to auscultation bilaterally, good aeration to bases  Abdomen:+BS, soft,NT/ND, no rebound/guarding, normal contour  Extr: no c/c/e  Neurological:  Moves extremities equally  Skin: Skin is warm and dry, no erythema noted    Labs:  Results for orders placed or performed during the hospital encounter of 06/28/23 (from the past 24 hour(s))   PT/INR - (Please order if pt is also on Warfarin)   Result Value Ref Range    PROTHROMBIN TIME 22.2 (H) 9.8 - 12.7 seconds    INR 1.90 (H) 0.84 - 1.10    Narrative    INR OF 2.0-3.0  RECOMMENDED FOR: PROPHYLAXIS/TREATMENT OF VENEOUS THROMBOSIS, PULMONARY EMBOLISM, PREVENTION OF SYSTEMIC EMBOLISM FROM ATRIAL FIBRILATION, MYOCARDIAL INFARCTION.    INR OF 2.5-3.5  RECOMMENDED FOR MECHANICAL PROSTHETIC HEART VALVES, RECURRENT SYSTEMIC EMBOLISM, RECURRENT MYOCARDIAL INFARCTION.     CBC/DIFF    Narrative    The following orders were  created for panel order CBC/DIFF.  Procedure                               Abnormality         Status                     ---------                               -----------         ------                     CBC WITH BJYN[[id number]]                Abnormal            Final result                 Please view results for these tests on the individual orders.   COMPREHENSIVE METABOLIC PANEL, NON-FASTING   Result Value Ref Range    SODIUM 134 (L) 136 - 145 mmol/L    POTASSIUM 3.3 (L) 3.5 - 5.1 mmol/L    CHLORIDE 102 98 - 107 mmol/L    CO2 TOTAL 25 21 - 31 mmol/L    ANION GAP 7 4 - 13 mmol/L    BUN 9 7 - 25 mg/dL    CREATININE 8.65 7.84 - 1.30 mg/dL    BUN/CREA RATIO 13 6 - 22    ESTIMATED GFR 102 >59 mL/min/1.64m^2    ALBUMIN 3.7 3.5 - 5.7 g/dL    CALCIUM 8.8 8.6 - 69.6 mg/dL    GLUCOSE 295 (H) 74 - 109 mg/dL    ALKALINE PHOSPHATASE 127 (H) 34 - 104 U/L    ALT  (SGPT) 50 7 - 52 U/L    AST (SGOT) 24 13 - 39 U/L    BILIRUBIN TOTAL 1.0 0.3 - 1.0 mg/dL    PROTEIN TOTAL 7.2 6.4 - 8.9 g/dL    ALBUMIN/GLOBULIN RATIO 1.1 0.8 - 1.4    OSMOLALITY, CALCULATED 268 (L) 270 - 290 mOsm/kg    CALCIUM, CORRECTED 9.0 8.9 - 10.8 mg/dL    GLOBULIN 3.5 2.9 - 5.4    Narrative    Estimated Glomerular Filtration Rate (eGFR) is calculated using the CKD-EPI (2021) equation, intended for patients 32 years of age and older. If gender is not documented or "unknown", there will be no eGFR calculation.     MAGNESIUM   Result Value Ref Range    MAGNESIUM 2.2 1.9 - 2.7 mg/dL   CBC WITH DIFF   Result Value Ref Range    WBC 9.7 3.8 - 11.8 x10^3/uL    RBC 4.18 3.63 - 4.92 x10^6/uL    HGB 12.2 10.9 - 14.3 g/dL    HCT 28.4 13.2 - 44.0 %    MCV 84.7 75.5 - 95.3 fL    MCH 29.3 24.7 - 32.8 pg    MCHC 34.6 32.3 - 35.6 g/dL    RDW 10.2 72.5 - 36.6 %    PLATELETS 323 140 - 440 x10^3/uL    MPV 7.4 (L) 7.9 - 10.8 fL    NEUTROPHIL % 72 43 - 77 %    LYMPHOCYTE % 18 16 - 44 %    MONOCYTE % 8 5 - 13 %    EOSINOPHIL % 2 1 -  7 %    BASOPHIL % 1 0 - 1 %    NEUTROPHIL # 7.00 1.85 - 7.80 x10^3/uL    LYMPHOCYTE # 1.70 1.00 - 3.00 x10^3/uL    MONOCYTE # 0.70 0.30 - 1.00 x10^3/uL    EOSINOPHIL # 0.20 0.00 - 0.50 x10^3/uL    BASOPHIL # 0.00 0.00 - 0.10 x10^3/uL   POC BLOOD GLUCOSE (RESULTS)   Result Value Ref Range    GLUCOSE, POC 123 (H) 70 - 100 mg/dl        Imaging:    Results for orders placed or performed during the hospital encounter of 06/28/23   CT ANGIO CHEST FOR PULMONARY EMBOLUS W IV CONTRAST     Status: None    Narrative    Macy Ackers    RADIOLOGIST: Wynema Birch, MD    CT ANGIO CHEST FOR PULMONARY EMBOLUS W IV CONTRAST performed on 06/28/2023 9:45 AM    CLINICAL HISTORY: new dvt w/ new chest pain and shob.  new dvt w/ new chest pain and sob    TECHNIQUE: CTA imaging of the chest with intravenous contrast.  3D reconstructions.  CONTRAST:  100 ml's of Omnipaque 350    COMPARISON: None.         FINDINGS:  Hardware:   None.    Lymph nodes:   No mediastinal, hilar, or axillary lymphadenopathy.    Heart:  Normal heart size.  No pericardial effusion.        RV/LV Diameter Ratio: 0.81    Thoracic Aorta:  No thoracic aortic aneurysm or dissection.    Pulmonary Vessels:  There is a pulmonary embolism in the right lower lobe branch involving a subsegmental branch.     Most Proximal Level Of Embolus (if embolus present): Subsegmental    Lungs and Airways:  There is some atelectasis dependently.    Pleura: No pleural effusion.  No pneumothorax.    Upper Abdomen: There is fatty infiltration of the liver.    Bones: There are mild degenerative changes of the thoracic spine.        Impression    THERE IS A SINGLE RIGHT LOWER LOBE PULMONARY EMBOLISM. THE VENTRICULAR RATIO IS 0.81.    Findings of the pulmonary embolism were discussed with Dr. Katrinka Blazing at 9:58 AM on 06/28/2023.      One or more dose reduction techniques were used (e.g., Automated exposure control, adjustment of the mA and/or kV according to patient size, use of iterative reconstruction technique).      Radiologist location ID: [id number]           Microbiology:  Hospital Encounter on 06/28/23 (from the past 96 hour(s))   URINE CULTURE,ROUTINE    Collection Time: 06/30/23 12:10 AM    Specimen: Urine, Clean Catch   Culture Result Status    URINE CULTURE No Growth Preliminary          Assessment/ Plan:   Acute pulmonary embolism  Unprovoked deep vein thrombosis  -CTA shows right lower lobe branch pulmonary embolism with subsegmental involvement  -IV anticoagulation Rx discontinued; Eliquis Rx initiated  -2D echo unremarkable for evidence of RV strain  -continue with telemetry monitoring  -Hematology/oncology consult appreciated  -outpatient follow up planned    Acute UTI  UTI, POA  -afebrile, HD stable, WBC 14-->11.0  -urinalysis consistent with acute process  -empiric antibiotic coverage with ciprofloxacin to continue    Depression/Anxiety   Mood Disorder  -Outpatient regimen to  continue   -Stable; no active  issues     GERD  -PPI Rx to continue     Deconditioned State  -PT as per schedule  -out of bed to chair encouraged     Code Status:  Full code    Diet:  Regular    DVT Prophylaxis   -SCDs/full anticoagulation    Disposition:  Pending clinical course    I have spent 55 minutes care for this patient with over half in discussion of the diagnosis and the importance of compliance with the treatment plan.  The remainder of the time was spent examining the patient and discussing the plan of care patient's family/care team    Lalla Brothers, MD    This note was partially generated using MModal Fluency Direct system, and there may be some incorrect words, spellings, and punctuation that were not noted in checking the note before saving.

## 2023-07-01 NOTE — PT Evaluation (Signed)
[facility]  [address]  [place], [address]  [phone]  (Fax) [phone]  Rehabilitation Services  Physical Therapy Inpatient Initial Evaluation    Patient Name: Terri Bautista  Date of Birth: 28-Dec-1970  Height: Height: 165.1 cm (5\' 5" )  Weight: Weight: 72.6 kg (160 lb 0.9 oz)  Room/Bed: 215/B  Payor: [employer] / Plan: [facility] / Product Type: Non Managed Care /       PMH:  Past Medical History:   Diagnosis Date    Depression     DVT (deep venous thrombosis) (CMS HCC)            Assessment:      (P) Terri Bautista is a 52 y/o female being treated for pulmonary emboli. At baseline, she is fully indpendent without use of assistive device. Presently, she demonstrates significant difficulty ambulating secondary to pain in L LE, decreased endurance, and shortness of breath. She will benefit from continued skilled PT intervention to improve endurance, achieve househol distances, and promote optimal recovery.    Discharge Needs:    Equipment Recommendation: (P) walker      The patient presents with mobility limitations due to impaired balance, impaired strength, weight bearing restrictions, and impaired functional activity tolerance that significantly impair/prevent patient's ability to participate in mobility-related activities of daily living (MRADLs) including  ambulation and transfers in order to safely complete, working, laundering/household tasks, safely entering/exiting the home. This functional mobility deficit can be sufficiently resolved with the use of a (P) walker  in order to decrease the risk of falls, morbidity, and mortality in performance of these MRADLs.  Patient is able to safely use this assistive device.    Discharge Disposition: (P) home with home health, home with 24/7 assistance    JUSTIFICATION OF DISCHARGE RECOMMENDATION   Based on current diagnosis, functional performance prior to admission, and current functional performance, this  patient requires continued PT services in (P) home with home health, home with 24/7 assistance in order to achieve significant functional improvements in these deficit areas: (P) aerobic capacity/endurance, gait, locomotion, and balance, muscle performance.        Plan:   Current Intervention: (P) balance training, gait training, patient/family education, stair training, strengthening  To provide physical therapy services (P)  (1-2x a day at least one day weekly)  for duration of (P) until discharge.    The risks/benefits of therapy have been discussed with the patient/caregiver and he/she is in agreement with the established plan of care.       Subjective & Objective     Past Medical History:   Diagnosis Date    Depression     DVT (deep venous thrombosis) (CMS [facility])             Past Surgical History:   Procedure Laterality Date    HX TONSILLECTOMY      HX TUBAL LIGATION                   07/01/23 0946   Rehab Session   Document Type evaluation   PT Visit Date 07/01/23   General Information   Patient Profile Reviewed yes   Pertinent History of Current Functional Problem Terri Bautista is a 52 y/o female admitted on 06/28/23 with c/c of chest pain. PMH is significant for, but not limited to, depression and DVT. Current principle problem is consistent with pulmonary emboli. PT orders are to eval and treat.   Medical Lines Telemetry  Respiratory Status room air   Existing Precautions/Restrictions full code   Mutuality/Individual Preferences   Anxieties, Fears or Concerns Patient expressed concerns regarding weightbearing on L LE secondary to pain.   Individualized Care Needs Monitor vitals; commode for toileting; practice ambulation daily.   Patient-Specific Goals (Include Timeframe) Return home upon discharge   Plan of Care Reviewed With spouse;patient   Patient would like to participate in bedside shift report Yes   Living Environment   Lives With spouse   Living Arrangements house   Home Assessment: Stairs in Home    Home Accessibility stairs within home;stairs to enter home   Stairs Within Home, Primary   Stair Railings, Within Home, Primary railings on both sides of stairs   Number of Stairs, Within Home, Primary other (see comments)  (16)   Stairs, Within Home, Primary 2 Flights   Home Main Entrance   Stair Railings, Main Entrance railings on both sides of stairs   Number of Stairs, Main Entrance other (see comments)  (2 flights)   Functional Level Prior   Ambulation 0 - independent   Transferring 0 - independent   Toileting 0 - independent   Bathing 0 - independent   Dressing 0 - independent   Prior Functional Level Comment Patient indpendent with all ADLs and functional mobility at baseline.   Pre Treatment Status   Pre Treatment Patient Status Patient supine in bed;Call light within reach;Telephone within reach;Nurse approved session   Support Present Pre Treatment  Family present   Communication Pre Treatment  Charge Nurse   Communication Pre Treatment Comment Cleared for PT participation.   Cognitive Assessment/Interventions   Behavior/Mood Observations cooperative   Orientation Status oriented x 4   Attention WNL/WFL   Follows Commands WNL   Pre- Treatment Vital Signs   Pre-Treatment Heart Rate (beats/min) 98   Pre SpO2 (%) 96   O2 Delivery Pre Treatment room air   Pre-Treatment Pain   Pretreatment Pain Rating 3/10   Pre/Posttreatment Pain Comment L LE   RUE Assessment   RUE Assessment WFL- Within Functional Limits   LUE Assessment   LUE Assessment WFL- Within Functional Limits   RLE Assessment   RLE Assessment WFL- Within Functional Limits   LLE Assessment   LLE Assessment X-Exceptions   LLE Strength 4-/5   Trunk Assessment   Trunk Assessment WFL for stated baseline   Mobility Assessment/Training   Additional Documentation Bed Mobility Assessment/Treatment (Group);Transfer Assessment/Treatment (Group);Gait Assessment/Treatment (Group)   Bed Mobility Assessment/Treatment   Sit to Supine, Independence modified  independence   Supine-Sit Independence modified independence   Transfer Assessment/Treatment   Sit-Stand-Sit, Assist Device elevated surface   Sit-Stand Independence stand-by assistance   Stand-Sit Independence stand-by assistance;verbal cues required  (Cues for hand placement for safety)   Gait Assessment/Treatment   Total Distance Ambulated 4   Impairments  balance impaired;endurance;pain;strength decreased   Deviations  cadence decreased;double stance time increased;lateral shift;step length decreased;weight-shifting ability decreased   Assistive Device  walker, front wheeled   Distance in Feet 4   Independence  contact guard assist   Maintain Weight Bearing Status unable to maintain  (Secondary to pain in L LE)   Safety Issues  balance decreased during turns;step length decreased;weight-shifting ability decreased   Post Treatment Status   Post Treatment Patient Status Patient supine in bed;Call light within reach;Telephone within reach   Support Present Post Treatment  Family present   Investment banker, operational Post Treatment Comment Patient status  Patient Effort adequate   Post-Treatment Vital Signs   Post-treatment Heart Rate (beats/min) 94   Post SpO2 (%) 98   O2 Delivery Post Treatment room air   Post-Treatment Pain   Posttreatment Pain Rating 6/10   Physical Therapy Clinical Impression   Assessment Terri Bautista is a 52 y/o female being treated for pulmonary emboli. At baseline, she is fully indpendent without use of assistive device. Presently, she demonstrates significant difficulty ambulating secondary to pain in L LE, decreased endurance, and shortness of breath. She will benefit from continued skilled PT intervention to improve endurance, achieve househol distances, and promote optimal recovery.   Criteria for Skilled Therapeutic meets criteria   Impairments Found (describe specific impairments) aerobic capacity/endurance;gait, locomotion, and balance;muscle performance    Functional Limitations in Following  self-care;home management;work;community/leisure   Rehab Potential good   Therapy Frequency   (1-2x a day at least one day weekly)   Predicted Duration of Therapy Intervention (days/wks) until discharge   Anticipated Equipment Needs at Discharge (PT) walker   Anticipated Discharge Disposition home with home health;home with 24/7 assistance   Evaluation Complexity Justification   Patient History: Co-morbidity/factors that impact Plan of Care 1-2 that impact Plan of Care   Examination Components Strength;Balance;Ambulation;Stair mobility   Presentation Stable: Uncomplicated, straight-forward, problem focused   Clinical Decision Making Low complexity   Evaluation Complexity Low complexity   Care Plan Goals   PT Rehab Goals Gait Training Goal;Stairs Training Goal   Planned Therapy Interventions, PT Eval   Planned Therapy Interventions (PT) balance training;gait training;patient/family education;stair training;strengthening   Stairs Training Goal   Stairs Training Goal, Date Established 07/01/23   Stairs Training Goal, Time to Achieve by discharge   Stairs Training Goal, Independence Level modified independence   Stairs Training Goal, Current Status other (see comments)  (Not assessed due to pain limitations)   Stairs Training Goal, Assist Device least restrictive assistive device   Stairs Training Goal, Number of Stairs to Achieve 16   Gait Training  Goal, Distance to Achieve   Gait Training  Goal, Date Established 07/01/23   Gait Training  Goal, Time to Achieve by discharge   Gait Training  Goal, Independence Level modified independence   Gait Training  Goal, Assist Device least restricted assistive device   Gait Training  Goal, Distance to Achieve 100   Physical Therapy Time and Intention   Total PT Minutes: 24   (INSERT FLOWSHEET)            INTERVENTION MINUTES: EVALUATION 24 minutes    EVALUATION COMPLEXITY : CLINICAL DECISION MAKING OF LOW COMPLEXITY AS INDICATED BY PMH,  PHYSICAL THERAPY ASSESSMENT OF MUSCULOSKELETAL AND NEUROLOGICAL SYSTEMS AND ACTIVITY LIMITATIONS. CLINICAL PRESENTATION IS STABLE AND UNCOMPLICATED    Therapist:     Phil Dopp, PT  07/01/2023, 11:35

## 2023-07-01 NOTE — Care Management Notes (Signed)
Therapy has recommended placement at [facility] for short term rehab.  Met with patient and husband and they agreed to this plan.  A referral was made this afternoon.  Physician indicated that patient will be ready for discharge tomorrow.  [facility] was asked to start insurance authorization if patient is approved clinically.

## 2023-07-01 NOTE — Consults (Signed)
Department of Hematology/Oncology   ONCOLOGY CONSULT NOTE      REFERRING PROVIDER:  No referring provider defined for this encounter.        Reason for Consult:  Unprovoked deep vein thrombosis PE  PCP:    HISTORY OF PRESENT ILLNESS:  Terri Bautista is a 52 y.o. female who presented to the hospital for evaluation of "lower left chest pain".  Patient was seen in the ER 06/27/2023 for low back pain that radiated down her left leg.  Pain was worse in her left calf.  Peripheral venous duplex showed a left lower extremity deep vein thrombosis in the left common femoral vein, femoral vein, popliteal vein, peroneal vein and posterior tibial vein.  Patient was discharged with Eliquis 10 mg b.i.d. for seven days and then decrease to one tablet b.i.d..  On 06/28/2023 patient presented to the ER with left lower chest pain and shortness of breath.  CTA of the chest showed right lower lobe pulmonary embolism involving the segmental branch.  Patient was admitted and started on heparin drip.  Hematology/oncology consultation was requested for evaluation of unprovoked deep vein thrombosis/PE.    Patient denies any recent travel, smoking, prolonged immobility, recent illness, OCP or hormone use, trauma and is unaware of any family history of blood clots.  Patient reports one week prior to diagnosis she was noticing some pain with walking in her lower calves and having swelling that would decrease with elevation.  She reports that her last mammogram was approximately two years ago and was normal.  She does not complete self-breast exams.  Unsure of last colonoscopy the patient does report issues with IBS.  She denies any blood in her stools.  Patient reports that in May of 2024 she passed out in her bathroom and hit her head neck on a metal trash can.  She reports that she got up and had washed her hands and was starting to leave the restroom when she passed out.  Clinical history from that ER visit states that she stood up from  the toilet became dizzy and then fell.  Husband reports that after this syncopal episode she was only mumbling and was out of it.  She was seen in the ER and CT of the brain was normal.  Since that time she has had issues with her left shoulder and left side of her body she is currently seeing an orthopedist for these issues.  She has also had issues with what is believed to be vertigo since June or July of this year.  She does report issues with headaches that are in the frontal area that she states were cluster headaches.  These used to her at least weekly but she notices a decrease in those presently.    Patient is unknown to [facility].     Pertinent admission diagnostics:  06/27/2023 deep vein thrombosis in the left common femoral vein, femoral vein, popliteal vein, peroneal vein, and posterior tibial vein.  06/28/2023 single right lower lobe pulmonary embolism involving a subsegmental branch  06/29/2023   Ran intermittent fever, Four plex negative, + UA and was started on Cipro    SUBJECTIVE:   Patient lying in bed awake and alert.  Husband at bedside.  She reports that she did get up to the chair today and has noticed some pain in her left calf that she describes as a tingling sensation.     Still has swelling in her left leg.  Denies any issues with  Eliquis at this time.      ROS:   Consitutional: energy level good, appetite good, no weight loss, no fever, no chills  HEENT: No epistaxis, no dysphagia, no gum bleeding  Cardiovascular: no chest pain, no palpitations, no orthopnea  Respiratory: No cough, no hemoptysis, no dyspnea, no pleurisy,m no wheezing  Gastrointestinal: No nausea, no vomiting, no hematemesis, no diarrhea, no constipation, no melena  Musculoskeletal: No swollen joints, no joint redness  Skin: No Rash, No nodules, no pruritus, no lesions  Neurologic: No seizures, no headache, no paresthesia  Psychiatric: no depression, no anxiety  Endocrine: no polyuria, no  polydipsia    HISTORY:  Past Medical History:   Diagnosis Date    Depression     DVT (deep venous thrombosis) (CMS HCC)          Past Surgical History:   Procedure Laterality Date    HX TONSILLECTOMY      HX TUBAL LIGATION           Social History     Socioeconomic History    Marital status: Married     Spouse name: Not on file    Number of children: Not on file    Years of education: Not on file    Highest education level: Not on file   Occupational History    Not on file   Tobacco Use    Smoking status: Former     Types: Cigarettes    Smokeless tobacco: Never   Vaping Use    Vaping status: Never Used   Substance and Sexual Activity    Alcohol use: Not Currently    Drug use: Never    Sexual activity: Not on file   Other Topics Concern    Not on file   Social History Narrative    Not on file     Social Determinants of Health     Financial Resource Strain: Not on file   Transportation Needs: Low Risk  (06/28/2023)    Transportation Needs     SDOH Transportation: No   Social Connections: Low Risk  (06/28/2023)    Social Connections     SDOH Social Isolation: 5 or more times a week   Intimate Partner Violence: Not on file   Housing Stability: Not on file     Family Medical History:    None         No outpatient medications have been marked as taking for the 06/28/23 encounter [facility] Encounter).      Allergies   Allergen Reactions    Bactrim [Sulfamethoxazole-Trimethoprim] Rash    Cephalexin        PHYSICAL EXAM:    BP 101/62   Pulse 83   Temp 37.9 C (100.3 F)   Resp 18   Ht 1.651 m (5\' 5" )   Wt 72.6 kg (160 lb 0.9 oz)   SpO2 96%   BMI 26.63 kg/m          ECOG Status: (0) Fully active, able to carry on all predisease performance without restriction      General: Awake, alert, NAD  HEENT: Head normocephalic, atraumatic.  Mucouse membranes moist.  PERRLA, no pallor, no icterus  Adenopathy: No palpable cervical, axillary, or inguinal adenopathy noted  Neck: No JVD, Supple, no adenopathy, No JVD  Lungs: Clear to  auscultation bilaterally.  No rales, rhonchi, no wheezing  Breast exam:  Right and left breast exam completed without evidence of disease, mass or skin lesions.  Cardiovascular: Regular rate and rhythm, No murmur, no friction rub  Abdomen: Soft, Symmetric, No tenderness, No hepatosplenomegaly, BSA x4  Extremities: + left lower extremity peripheral edema. Warm. No calf tenderness  Skin: Skin warm and dry.  No suspicious lesions  Neurologic: Alert and oriented x3. Cranial nerves II through XII intact. No focal paralysis or asterixis  Psych: Mood and affect congruent for age and gender, Denies SI/HI       DIAGNOSTIC DATA:  Results for orders placed during the hospital encounter of 06/27/23    PERIPHERAL VENOUS DUPLEX - LOWER    Interpretation Summary  Sheriann Reppert    RADIOLOGIST: Murray Hodgkins    EXAMINATION: PERIPHERAL VENOUS DUPLEX - LOWER    EXAM DATE/TIME:  06/27/2023 10:54 AM    CLINICAL INDICATION: M79.606: Leg pain    COMPARISON: None.    FINDINGS    There is deep venous thrombosis seen within the left common femoral vein, femoral vein, popliteal vein, peroneal vein, and posterior tibial vein. There is normal flow within the left anterior tibial vein. The right common femoral vein showed flow.    Impression  Left lower extremity DVT discussed above.    A Significant [place] actionable finding has been sent via the [employer] Actionable Findings application on 06/27/2023 11:07 AM, Message ID [id number]. Receipt of this communication will be communicated to [place] [facility] or responsible provider and will be documented in [employer] Actionable Findings System upon receiving the acknowledgement.      Radiologist location ID: [id number]  Results for orders placed during the hospital encounter of 06/28/23    CT ANGIO CHEST FOR PULMONARY EMBOLUS W IV CONTRAST    Narrative  Jalayla Wirt    RADIOLOGIST: Wynema Birch, MD    CT ANGIO CHEST FOR PULMONARY EMBOLUS W IV CONTRAST performed on 06/28/2023 9:45  AM    CLINICAL HISTORY: new dvt w/ new chest pain and shob.  new dvt w/ new chest pain and sob    TECHNIQUE: CTA imaging of the chest with intravenous contrast.  3D reconstructions.  CONTRAST:  100 ml's of Omnipaque 350    COMPARISON: None.    FINDINGS:  Hardware:  None.    Lymph nodes:   No mediastinal, hilar, or axillary lymphadenopathy.    Heart:  Normal heart size.  No pericardial effusion.  RV/LV Diameter Ratio: 0.81    Thoracic Aorta:  No thoracic aortic aneurysm or dissection.    Pulmonary Vessels:  There is a pulmonary embolism in the right lower lobe branch involving a subsegmental branch.  Most Proximal Level Of Embolus (if embolus present): Subsegmental    Lungs and Airways:  There is some atelectasis dependently.    Pleura: No pleural effusion.  No pneumothorax.    Upper Abdomen: There is fatty infiltration of the liver.    Bones: There are mild degenerative changes of the thoracic spine.    Impression  THERE IS A SINGLE RIGHT LOWER LOBE PULMONARY EMBOLISM. THE VENTRICULAR RATIO IS 0.81.    Findings of the pulmonary embolism were discussed with Dr. Katrinka Blazing at 9:58 AM on 06/28/2023.      One or more dose reduction techniques were used (e.g., Automated exposure control, adjustment of the mA and/or kV according to patient size, use of iterative reconstruction technique).      Radiologist location ID: [id number]      LABS:     Basic Metabolic Profile    Lab Results   Component Value Date/Time    SODIUM 134 (  L) 07/01/2023 06:18 AM    POTASSIUM 3.3 (L) 07/01/2023 06:18 AM    CHLORIDE 102 07/01/2023 06:18 AM    CO2 25 07/01/2023 06:18 AM    ANIONGAP 7 07/01/2023 06:18 AM    Lab Results   Component Value Date/Time    BUN 9 07/01/2023 06:18 AM    CREATININE 0.71 07/01/2023 06:18 AM    GLUCOSENF 119 (H) 07/01/2023 06:18 AM          CBC  Diff   Lab Results   Component Value Date/Time    WBC 9.7 07/01/2023 06:18 AM    HGB 12.2 07/01/2023 06:18 AM    HCT 35.4 07/01/2023 06:18 AM    PLTCNT 323 07/01/2023 06:18 AM     RBC 4.18 07/01/2023 06:18 AM    MCV 84.7 07/01/2023 06:18 AM    MCHC 34.6 07/01/2023 06:18 AM    MCH 29.3 07/01/2023 06:18 AM    RDW 13.5 07/01/2023 06:18 AM    MPV 7.4 (L) 07/01/2023 06:18 AM    Lab Results   Component Value Date/Time    PMNS 72 07/01/2023 06:18 AM    LYMPHOCYTES 18 07/01/2023 06:18 AM    EOSINOPHIL 2 07/01/2023 06:18 AM    MONOCYTES 8 07/01/2023 06:18 AM    BASOPHILS 1 07/01/2023 06:18 AM    BASOPHILS 0.00 07/01/2023 06:18 AM    PMNABS 7.00 07/01/2023 06:18 AM    LYMPHSABS 1.70 07/01/2023 06:18 AM    EOSABS 0.20 07/01/2023 06:18 AM    MONOSABS 0.70 07/01/2023 06:18 AM            ASSESSMENT:    ICD-10-CM    1. Other acute pulmonary embolism without acute cor pulmonale (CMS HCC)  I26.99       2. Cervical radiculopathy  M54.12       3. Acute deep vein thrombosis of proximal leg, left (CMS HCC)  I82.4Y2       4. Acute deep vein thrombosis of lower leg, left (CMS HCC)  I82.4Z2       5. Syncope  R55 TRANSTHORACIC ECHOCARDIOGRAM - ADULT        PE/left Leg DVT:  Unprovoked.    Hypercoagulable testing ordered.   Not considered a failure on Eliquis, due to <24 hours on medication  Heparin drip started and then changed to Eliquis given with loading dose.        PLAN:   1.)Continue Eliquis 10 mg PO BID x 7 days and then decrease to 5 mg PO BID.      2.) Patient will need a minimum of 3 months of anticoagulation following discharge.  Will need follow up Left leg venous duplex for resolution of clot before stopping Eliquis.   3.) Patient educated to be diligent about monitoring for abnormal bleeding including but not limited epistaxis, hemoptysis, bright red rectal bleeding, black tarry stools, hematuria or any other abnormal bleeding. Also advised to be careful with injuries including but not limited to head injuries with or with out loss of consciousness should be evaluated to ensure no abnormal bleeding, patient verbalized understanding.   Also educated patient about travel precautions with compression socks  and frequent stops walk to help prevent future blood clots.   4.)  Recommended patient get outpatient mammogram, colonoscopy and Pap smear due to her unprovoked deep vein thrombosis/PE.  Patient has stated understanding of this.  5.) Following discharge patient will follow up with outpatient [facility] [place] in 4-6 weeks  6.) Supportive care.  Thank you for this consultation and allowing [place] to participate in the care of this patient.        Joeline Rochester was given the chance to ask questions, and these were answered to their satisfaction. The patient is welcome to call with any questions or concerns in the meantime.     Thank you for this consultation and allowing [place] to participate in this patients care.    Patient seen independently today, Case was discussed with Primary hematologist/oncologist Dr. Melynda Keller, The above plan was advised, ordered, and implemented.     Marvene Staff APRN, FNP-BC, AOCNP, 07/01/2023 , 13:07       CC:  [facility]  [address]  [place] [address]    No referring provider defined for this encounter.

## 2023-07-01 NOTE — OT Evaluation (Signed)
[facility] Medicine [facility]  [address]  [place], [address]  [phone]  (Fax) [phone]  Rehabilitation Services  Occupational Therapy Inpatient Initial Evaluation      Patient Name: Terri Bautista  Date of Birth: [birth year]  Height: Height: 165.1 cm (5\' 5" )  Weight: Weight: 72.6 kg (160 lb 0.9 oz)  Room/Bed: 215/B  Payor: [employer]ICARE / Plan: [employer] [facility] / Product Type: Non Managed Care /         PMH:   Past Medical History:   Diagnosis Date    Depression     DVT (deep venous thrombosis) (CMS HCC)            Assessment:      Functional Level at Time of Session: (P) Patient is a pleasant 52 year old female admitted for pulmonary emboli. Prior to admission, she was independent in ADLs, IADLs and functional mobility. During OT evaluation, patient was alert, oriented x4, cooperative and able to follow multistep commands. She has full UB AROM and good (4/5) UB strength. She was able to demonstrate dressing with CGA-min A due to LLE pain. Patient would benefit from IPR upon discharge. OT to follow up during acute stay for ADL training and functional transfer training.    Discharge Needs:   Equipment Recommendation:  BSC, FWW    The patient presents with mobility limitations due to impaired balance and LLE pain  that significantly impair/prevent patient's ability to participate in mobility-related activities of daily living (MRADLs) including  ambulation and transfers in order to safely complete, toileting, bathing, food preparation, working, laundering/household tasks, safely entering/exiting the home, in reasonable time. This functional mobility deficit can be sufficiently resolved with the use of a Anticipated Equipment Needs at Discharge: (P) bedside commode, front wheeled walker  in order to decrease the risk of falls, morbidity, and mortality in performance of these MRADLs.  Patient is able to safely use this assistive device.    Discharge Disposition:  inpatient  rehabilitation facility    JUSTIFICATION OF DISCHARGE RECOMMENDATION   Based on current diagnosis, functional performance prior to admission, and current functional performance, this patient requires continued OT services in Anticipated Discharge Disposition: (P) inpatient rehabilitation facility  in order to achieve significant functional improvements.    Plan:   Current Intervention:  Predicted Duration of Therapy: (P) until discharge    To provide Occupational therapy services  Therapy Frequency: (P) minimum of 1x/week for duration of Predicted Duration of Therapy: (P) until discharge  .       The risks/benefits of therapy have been discussed with the patient/caregiver and he/she is in agreement with the established plan of care.       Subjective & Objective     MEDICAL HISTORY:   Past Medical History:   Diagnosis Date    Depression     DVT (deep venous thrombosis) (CMS HCC)          SURGICAL HISTORY:   Past Surgical History:   Procedure Laterality Date    HX TONSILLECTOMY      HX TUBAL LIGATION         ipp    INSERT FLOW SHEET     07/01/23 1328   Rehab Session   Document Type evaluation   OT Visit Date 07/01/23   Total OT Minutes: 15   Patient Effort good   General Information   Patient Profile Reviewed yes   Medical Lines PIV Line;Telemetry   Respiratory Status room air  Existing Precautions/Restrictions other (see comments)  (bleeding precaution)   Pre Treatment Status   Pre Treatment Patient Status Call light within reach;Telephone within reach;Patient safety alarm activated;Nurse approved session;Patient sitting in bedside chair or w/c   Support Present Pre Treatment  Family present   Communication Pre Treatment  Charge Nurse   Communication Pre Treatment Comment Cleared for OT   Living Environment   Lives With spouse   Living Arrangements house   Home Accessibility stairs within home   Living Environment Comment She has a walk-in shower and DME/AE   Stairs Within Home, Primary   Stairs, Within Home,  Primary They live in a split level house. There are 8 steps + landing + additional 8 steps.   Functional Level Prior   Ambulation 0 - independent   Transferring 0 - independent   Toileting 0 - independent   Bathing 0 - independent   Dressing 0 - independent   Eating 0 - independent   Communication 0 - understands/communicates without difficulty   Swallowing 0-->swallows foods/liquids without difficulty   Vital Signs   Pre-Treatment Heart Rate (beats/min) 89   Post-treatment Heart Rate (beats/min) 88   Pre SpO2 (%) 95   O2 Delivery Pre Treatment room air   Post SpO2 (%) 95   O2 Delivery Post Treatment room air   Coping/Psychosocial   Observed Emotional State calm;cooperative   Verbalized Emotional State acceptance   Family/Support System   Family/Support Persons spouse   Involvement in Care at bedside;attentive to patient;interacting with patient;participating in care   Coping/Psychosocial Response Interventions   Plan Of Care Reviewed With patient;spouse   Cognitive Assessment/Interventions   Behavior/Mood Observations behavior appropriate to situation, WNL/WFL   Orientation Status oriented x 4   Attention WNL/WFL   Follows Commands WFL   Vision Assessment/Interventions   Visual Impairment/Limitations WFL with corrective lenses   RUE Assessment   RUE Assessment WFL- Within Functional Limits   LUE Assessment   LUE Assessment WFL- Within Functional Limits   Grip Strength   Grip Left (4/5) good, left   Right Grip (4/5) good, right   Transfer Assessment/Treatment   Sit-Stand Independence stand-by assistance   Stand-Sit Independence stand-by assistance   Lower Body Dressing Assessment/Training   Position sitting;standing   DRESSING ASSESSED Don Socks;Don Pants- pull up;Doff Socks;Doff Pants- pull up   Independence Level  contact guard assist;minimum assist (75% patient effort)   Impairments pain   Self-Feeding Assessment/Training   Position  sitting   SELF FEEDING ASSESSED Self-feeding   Independence Level independent    Post Treatment Status   Post Treatment Patient Status Patient sitting in bedside chair or w/c;Call light within reach;Patient safety alarm activated;Telephone within reach   Support Present Post Treatment  Family present   Communication Post Treatement Charge Nurse;Care Management   Communication Post Treatment Comment Updated about patient; D/C recommendation   Care Plan Goals   OT Rehab Goals Bathing Goal;LB Dressing Goal;Grooming Goal;Toileting Goal   Bathing Goal   Bathing Goal, Date Established 07/01/23   Bathing Goal, Time to Achieve by discharge   Bathing Goal, Activity Type all bathing tasks   Bathing Goal, Independence Level modified independence   Bathing Goal, Adaptive Equipment shower chair   Grooming Goal   Grooming Goal, Date Established 07/01/23   Grooming Goal, Time to Achieve by discharge   Grooming Goal, Activity Type all grooming tasks   Grooming Goal, Independence  stand-by assistance   LB Dressing Goal   LB Dressing Goal, Date Established  07/01/23   LB Dressing Goal, Time to Achieve by discharge   LB Dressing Goal, Activity Type all lower body dressing tasks   LB Dressing Goal, Independence Level independent   Toileting Goal   Toileting Goal, Date Established 07/01/23   Toileting Goal, Time to Achieve by discharge   Toileting Goal, Activity Type all toileting tasks   Toileting Goal, Independence Level independent   Planned Therapy Interventions, OT Eval   Planned Therapy Interventions ADL retraining   Clinical Impression   Functional Level at Time of Session Patient is a pleasant 52 year old female admitted for pulmonary emboli. Prior to admission, she was independent in ADLs, IADLs and functional mobility. During OT evaluation, patient was alert, oriented x4, cooperative and able to follow multistep commands. She has full UB AROM and good (4/5) UB strength. She was able to demonstrate dressing with CGA-min A due to LLE pain. Patient would benefit from IPR upon discharge. OT to follow up  during acute stay for ADL training and functional transfer training.   Criteria for Skilled Therapeutic Interventions Met (OT) yes;meets criteria;skilled treatment is necessary   Rehab Potential good   Therapy Frequency minimum of 1x/week   Predicted Duration of Therapy until discharge   Anticipated Equipment Needs at Discharge bedside commode;front wheeled walker   Anticipated Discharge Disposition inpatient rehabilitation facility   Evaluation Complexity Justification   Occupational Profile Review Brief history   Performance Deficits 1-3 deficits   Clinical Decision Making Low analytic complexity   Evaluation Complexity Low       TREATMENT PLAN: THERAPEUTIC ACTIVITIES and ADL/IADL TRAINING  EVALUATION COMPLEXITY: CLINICAL DECISION MAKING OF LOW COMPLEXITY AS INDICATED BY PMH, OCCUPATIONAL THERAPY ASSESSMENT OF MUSCULOSKELETAL AND NEUROLOGICAL SYSTEMS AND ACTIVITY LIMITATIONS. CLINICAL PRESENTATION IS STABLE AND UNCOMPLICATED.      EVALUATION 15 minutes    Therapist:      Hildred Laser, OT,07/01/2023 14:02

## 2023-07-01 NOTE — Nurses Notes (Signed)
LEFT LOWER LEG TIGHT WITH EDEMA TODAY, PAIN PILL GIVEN EARLY WITH SOME RELIEF, LIDOCAINE PATCHES APPLIED TO LEFT CALF THIS AFTERNOON, HAS BEEN UP IN VASCULAR CHAIR ALSO.  HUSBAND REQUESTED WALKER FOR PATIENT TO USE, UNABLE TO FIND ONE ON THE FLOOR.

## 2023-07-02 ENCOUNTER — Telehealth (INDEPENDENT_AMBULATORY_CARE_PROVIDER_SITE_OTHER): Payer: Self-pay

## 2023-07-02 DIAGNOSIS — I2699 Other pulmonary embolism without acute cor pulmonale: Secondary | ICD-10-CM

## 2023-07-02 LAB — COMPREHENSIVE METABOLIC PANEL, NON-FASTING
ALBUMIN/GLOBULIN RATIO: 1 (ref 0.8–1.4)
ALBUMIN: 3.5 g/dL (ref 3.5–5.7)
ALKALINE PHOSPHATASE: 119 U/L — ABNORMAL HIGH (ref 34–104)
ALT (SGPT): 37 U/L (ref 7–52)
ANION GAP: 8 mmol/L (ref 4–13)
AST (SGOT): 17 U/L (ref 13–39)
BILIRUBIN TOTAL: 0.8 mg/dL (ref 0.3–1.0)
BUN/CREA RATIO: 13 (ref 6–22)
BUN: 9 mg/dL (ref 7–25)
CALCIUM, CORRECTED: 9.2 mg/dL (ref 8.9–10.8)
CALCIUM: 8.8 mg/dL (ref 8.6–10.3)
CHLORIDE: 103 mmol/L (ref 98–107)
CO2 TOTAL: 27 mmol/L (ref 21–31)
CREATININE: 0.72 mg/dL (ref 0.60–1.30)
ESTIMATED GFR: 100 mL/min/{1.73_m2} (ref >59)
GLOBULIN: 3.4 (ref 2.9–5.4)
GLUCOSE: 109 mg/dL (ref 74–109)
OSMOLALITY, CALCULATED: 275 mOsm/kg (ref 270–290)
POTASSIUM: 4.3 mmol/L (ref 3.5–5.1)
PROTEIN TOTAL: 6.9 g/dL (ref 6.4–8.9)
SODIUM: 138 mmol/L (ref 136–145)

## 2023-07-02 LAB — CBC WITH DIFF
BASOPHIL #: 0.1 10*3/uL (ref 0.00–0.10)
BASOPHIL %: 1 % (ref 0–1)
EOSINOPHIL #: 0.3 10*3/uL (ref 0.00–0.50)
EOSINOPHIL %: 4 % (ref 1–7)
HCT: 32.8 % (ref 31.2–41.9)
HGB: 11.2 g/dL (ref 10.9–14.3)
LYMPHOCYTE #: 1.7 10*3/uL (ref 1.00–3.00)
LYMPHOCYTE %: 20 % (ref 16–44)
MCH: 29.2 pg (ref 24.7–32.8)
MCHC: 34.2 g/dL (ref 32.3–35.6)
MCV: 85.3 fL (ref 75.5–95.3)
MONOCYTE #: 0.8 10*3/uL (ref 0.30–1.00)
MONOCYTE %: 10 % (ref 5–13)
MPV: 7 fL — ABNORMAL LOW (ref 7.9–10.8)
NEUTROPHIL #: 5.5 10*3/uL (ref 1.85–7.80)
NEUTROPHIL %: 66 % (ref 43–77)
PLATELETS: 340 10*3/uL (ref 140–440)
RBC: 3.84 10*6/uL (ref 3.63–4.92)
RDW: 13.3 % (ref 12.3–17.7)
WBC: 8.3 10*3/uL (ref 3.8–11.8)

## 2023-07-02 LAB — BETA-2 GLYCOPROTEIN 1 ANTIBODIES, IGG, IGM, SERUM
B2GP1 IGG QUANTITATIVE: <1.4 U/mL (ref <=19)
B2GP1 IGG-QUALITATIVE: NEGATIVE
B2GP1 IGM QUANTITATIVE: <1.5 U/mL (ref <=19)
B2GP1 IGM-QUALITATIVE: NEGATIVE

## 2023-07-02 LAB — PHOSPHOLIPID (CARDIOLIPIN) IGG & IGM
CARDIOLIPIN IGG QUALITATIVE: NEGATIVE
CARDIOLIPIN IGG QUANTITATIVE: <1.6 U/mL (ref <=19)
CARDIOLIPIN IGM QUALITATIVE: NEGATIVE
CARDIOLIPIN IGM QUANTITATIVE: <1.5 U/mL (ref <=19)

## 2023-07-02 LAB — URINE CULTURE,ROUTINE: URINE CULTURE: NO GROWTH — AB

## 2023-07-02 MED ORDER — HYDROCODONE 5 MG-ACETAMINOPHEN 325 MG TABLET
1.0000 | ORAL_TABLET | Freq: Four times a day (QID) | ORAL | 0 refills | Status: AC | PRN
Start: 2023-07-02 — End: ?

## 2023-07-02 MED ORDER — CIPROFLOXACIN 500 MG TABLET
500.0000 mg | ORAL_TABLET | Freq: Two times a day (BID) | ORAL | 0 refills | Status: AC
Start: 2023-07-02 — End: 2023-07-09

## 2023-07-02 MED ORDER — LIDOCAINE 4 % TOPICAL PATCH
2.0000 | MEDICATED_PATCH | Freq: Every day | CUTANEOUS | 0 refills | Status: AC
Start: 2023-07-03 — End: 2023-07-17

## 2023-07-02 MED ORDER — APIXABAN 5 MG TABLET
ORAL_TABLET | ORAL | 0 refills | Status: DC
Start: 2023-07-02 — End: 2023-08-03

## 2023-07-02 NOTE — Telephone Encounter (Signed)
Copied from CRM #[id number]. Topic: General - General Question/Other  >> Jul 02, 2023  1:19 PM Karna Christmas wrote:  Pt is being discharged for a DVT and PE. She needs a follow up for this. All of her info is in Epic. Please reach out to pt to schedule.    Thank you

## 2023-07-02 NOTE — Care Management Notes (Signed)
Patient decided she did want HH and chose [facility] according to nursing. Referral placed and order sent. [facility] approved to take patient and will follow up upon discharge.

## 2023-07-02 NOTE — Discharge Summary (Signed)
[facility]  DISCHARGE SUMMARY    PATIENT NAME:  Terri Bautista, Terri Bautista  MRN:  [id number]  DOB:  [birth year]    ENCOUNTER DATE:  06/28/2023  INPATIENT ADMISSION DATE: 06/28/2023  DISCHARGE DATE:  07/02/2023    ATTENDING PHYSICIAN: Lalla Brothers, MD  SERVICE: PRN HOSPITALIST 1  PRIMARY CARE PHYSICIAN: [facility]       No lay caregiver identified.    PRIMARY DISCHARGE DIAGNOSIS: Pulmonary emboli (CMS [facility])  Active Hospital Problems    Diagnosis Date Noted    Principal Problem: Pulmonary emboli (CMS HCC) [I26.99] 06/28/2023    Acute deep vein thrombosis of lower leg, left (CMS [facility]) [I82.4Z2] 06/29/2023      Resolved Hospital Problems   No resolved problems to display.     There are no active non-hospital problems to display for this patient.          Current Discharge Medication List        START taking these medications.        Details   ciprofloxacin HCl 500 mg Tablet  Commonly known as: CIPRO   500 mg, Oral, 2 TIMES DAILY  Qty: 14 Tablet  Refills: 0     HYDROcodone-acetaminophen 5-325 mg Tablet  Commonly known as: NORCO   1 Tablet, Oral, EVERY 6 HOURS PRN  Qty: 12 Tablet  Refills: 0     lidocaine 4 % Adhesive Patch, Medicated  Start taking on: July 03, 2023   2 Patches, Transdermal, DAILY  Qty: 28 Patch  Refills: 0            CONTINUE these medications which have CHANGED during your visit.        Details   apixaban 5 mg Tablet  Commonly known asEverlene Balls  Start taking on: July 02, 2023  What changed: See the new instructions.   Take 2 Tablets (10 mg total) by mouth Twice daily for 5 days, THEN 1 Tablet (5 mg total) Twice daily for 23 days. Indications: a clot in the lung, blood clot in a deep vein of the extremities.  Qty: 66 Tablet  Refills: 0            CONTINUE these medications - NO CHANGES were made during your visit.        Details   acetaminophen 325 mg Tablet  Commonly known as: TYLENOL   325 mg, Oral, EVERY 4 HOURS PRN  Refills: 0     gabapentin 300 mg Capsule  Commonly known as:  NEURONTIN   300 mg, Oral  Refills: 0     meloxicam 15 mg Tablet  Commonly known as: MOBIC   15 mg, Oral, DAILY  Qty: 10 Tablet  Refills: 0     pantoprazole 40 mg Tablet, Delayed Release (E.C.)  Commonly known as: PROTONIX   40 mg, Oral, DAILY  Qty: 14 Tablet  Refills: 0     sertraline 100 mg Tablet  Commonly known as: ZOLOFT   100 mg, Oral, DAILY  Refills: 0            STOP taking these medications.      Methylprednisolone 4 mg Tablets, Dose Pack  Commonly known as: MEDROL DOSEPACK     tiZANidine 4 mg Tablet  Commonly known as: ZANAFLEX            Discharge med list refreshed?  YES     Allergies   Allergen Reactions    Bactrim [Sulfamethoxazole-Trimethoprim] Rash    Cephalexin  HOSPITAL PROCEDURE(S):   No orders of the defined types were placed in this encounter.      REASON FOR HOSPITALIZATION AND HOSPITAL COURSE   BRIEF HPI:  This is a 52 y.o., female admitted for deep vein thrombosis/PE      BRIEF HOSPITAL NARRATIVE:   It was elected to admit the patient for further evaluation in the setting of CTA evidenced pulmonary embolism and doppler studies which revealed a left lower extremity deep vein thrombosis.  The patient responded well to anticoagulation Rx and demonstrated progressive clinical improvement throughout the duration of the hospital stay.  She denied any chest pain, palpitations, or shortness on breath while she was admitted.  She had a transthoracic echocardiogram which was unremarkable for any evidence right ventricular strain and telemetry monitoring did not reveal any clinically significant rhythm disturbances.  The patient was noted to have have had difficulty ambulating secondary to left lower extremity discomfort and was evaluated by the Physical therapy team.  Initially, plans were made for the patient was to be discharged to [facility] for inpatient Rehab prior to returning home, however, the patient and her husband deferred this option and expressed preference to return home with home  health.  While admitted, the patient was found to have evidence of an acute urinary tract infection and was started on ciprofloxacin Rx.  She will be discharged home with plans for her to complete the remainder of the antibiotic course on an outpatient basis.  She will continue to follow up with the PCP in the near future for continued management of her chronic medical issues.    TRANSITION/POST DISCHARGE CARE/PENDING TESTS/REFERRALS:  PCP/urology    CONDITION ON DISCHARGE:  A. Ambulation: Full ambulation  B. Self-care Ability: Complete  C. Cognitive Status Alert and Oriented x 3  D. Code status at discharge:   Full code    Physical Exam   Constitutional:  Alert/oriented x3, pleasant, sitting up in chair, family present  HENT:  Moist mucous membranes  Head: Normocephalic and atraumatic.   Mouth/Throat: Oropharynx is clear and moist.   Eyes: Pupils are equal, round, and reactive to light. Conjunctivae and EOM are normal.   Neck: Neck supple.   Cardiovascular: S1, S2, Normal rate, regular rhythm and normal heart sounds.  No murmur heard.  Pulmonary/Chest: Breath sounds normal. No respiratory distress. no wheezes.  no rales.   Abdominal: Soft. Bowel sounds are normal.  no distension. no abdominal tenderness.   Musculoskeletal: Normal range of motion.      General: No deformity or edema.   Neurological:  Moves all extremities equally  Skin: Skin is warm and dry. No erythema.          LINES/DRAINS/WOUNDS AT DISCHARGE:   Patient Lines/Drains/Airways Status       Active Line / Dialysis Catheter / Dialysis Graft / Drain / Airway / Wound       Name Placement date Placement time Site Days    Peripheral IV Left;Proximal Basilic  (medial side of arm) 06/28/23  1115  -- 4                    DISCHARGE DISPOSITION:  Home discharge  DISCHARGE INSTRUCTIONS:  Post-Discharge Follow Up Appointments       Follow up with Arlana Hove, DO    Phone: [phone]    Where: [facility]    Follow up with [facility] in 1 week    Phone: [phone]    Where:  [address], [place] [address]    Follow up with Lupita Dawn, MD in 4 weeks    Phone: [phone]    Where: [facility]          No discharge procedures on file.       Lalla Brothers, MD    Copies sent to Care Team         Relationship Specialty Notifications Start End    [facility] PCP - General EXTERNAL  02/23/23     Phone: [phone] Fax: [phone]         [address] [place] [address]            Referring providers can utilize [email or web address] to access their referred [facility] patient's information.

## 2023-07-02 NOTE — Nurses Notes (Signed)
Pt husband on floor to take pt home. Voiced understanding of discharge instructions, medications, and follow up appointments. Assisted to car via wheelchair by staff member.

## 2023-07-02 NOTE — PT Treatment (Signed)
[facility] Medicine [facility]  [address]  [place], [address]  [phone]  (Fax) [phone]  Rehabilitation Department  Physical Therapy Daily Inpatient Note    Date: 07/02/2023  Patient's Name: Terri Bautista  Date of Birth: 1971-08-31  Height: Height: 165.1 cm (5\' 5" )  Weight: Weight: 72.6 kg (160 lb 0.9 oz)      Plan: Will continue under current POC.      Discharge Disposition: (P) home with home health        Subjective/Objective/Assessment:  Flowsheet    07/02/23 1027   Rehab Session   Document Type therapy progress note (daily note)   PT Visit Date 07/02/23   General Information   Patient Profile Reviewed yes   Medical Lines PIV Line;Telemetry   Respiratory Status room air   Existing Precautions/Restrictions weight bearing restriction  (bleeding precaution, WBAT)   Pre Treatment Status   Pre Treatment Patient Status Nurse approved session;Patient sitting in bedside chair or w/c   Support Present Pre Treatment  None   Pre- Treatment Vital Signs   Pre-Treatment Heart Rate (beats/min) 89   Pre SpO2 (%) 97   O2 Delivery Pre Treatment room air   Pre-Treatment Pain   Pretreatment Pain Rating 7/10   Pre/Posttreatment Pain Comment LLE   Transfer Assessment/Treatment   Sit-Stand Independence stand-by assistance   Stand-Sit Independence stand-by assistance   Gait Assessment/Treatment   Total Distance Ambulated 35   Independence  minimum assist (75% patient effort)   Assistive Device  hand-held assistance   Deviations  cadence decreased;double stance time increased;stride length decreased   Comment step-to gt pattern   Post Treatment Status   Post Treatment Patient Status Patient sitting in bedside chair or w/c   Support Present Post Treatment  None   Patient Effort good   Post-Treatment Vital Signs   Post-treatment Heart Rate (beats/min) 105   Post SpO2 (%) 96   O2 Delivery Post Treatment room air   Cognitive Assessment/Intervention   Behavior/Mood Observations behavior appropriate to situation,  WNL/WFL   Physical Therapy Time and Intention   Total PT Minutes: 9   Therapy Plan Review/Discharge Plan (PT)   Anticipated Discharge Disposition home with home health     Pt tolerated gt trng fairly well. Pt wanted to attempt with no AD. She was pleased she was able to gt with HHA but a RW would provide more stability while LLE pain is present. D/C planning in progress.             Intervention minutes: GAIT TRAINING [address] MINUTES    THERAPIST  Raynald Kemp, PTA  07/02/2023, 12:43

## 2023-07-02 NOTE — Consults (Signed)
Department of Hematology/Oncology   ONCOLOGY CONSULT NOTE      REFERRING PROVIDER:  No referring provider defined for this encounter.        Reason for Consult:  Unprovoked deep vein thrombosis PE  PCP:    HISTORY OF PRESENT ILLNESS:  Terri Bautista is a 52 y.o. female who presented to the hospital for evaluation of "lower left chest pain".  Patient was seen in the ER 06/27/2023 for low back pain that radiated down her left leg.  Pain was worse in her left calf.  Peripheral venous duplex showed a left lower extremity deep vein thrombosis in the left common femoral vein, femoral vein, popliteal vein, peroneal vein and posterior tibial vein.  Patient was discharged with Eliquis 10 mg b.i.d. for seven days and then decrease to one tablet b.i.d..  On 06/28/2023 patient presented to the ER with left lower chest pain and shortness of breath.  CTA of the chest showed right lower lobe pulmonary embolism involving the segmental branch.  Patient was admitted and started on heparin drip.  Hematology/oncology consultation was requested for evaluation of unprovoked deep vein thrombosis/PE.    Patient denies any recent travel, smoking, prolonged immobility, recent illness, OCP or hormone use, trauma and is unaware of any family history of blood clots.  Patient reports one week prior to diagnosis she was noticing some pain with walking in her lower calves and having swelling that would decrease with elevation.  She reports that her last mammogram was approximately two years ago and was normal.  She does not complete self-breast exams.  Unsure of last colonoscopy the patient does report issues with IBS.  She denies any blood in her stools.  Patient reports that in May of 2024 she passed out in her bathroom and hit her head neck on a metal trash can.  She reports that she got up and had washed her hands and was starting to leave the restroom when she passed out.  Clinical history from that ER visit states that she stood up from  the toilet became dizzy and then fell.  Husband reports that after this syncopal episode she was only mumbling and was out of it.  She was seen in the ER and CT of the brain was normal.  Since that time she has had issues with her left shoulder and left side of her body she is currently seeing an orthopedist for these issues.  She has also had issues with what is believed to be vertigo since June or July of this year.  She does report issues with headaches that are in the frontal area that she states were cluster headaches.  These used to her at least weekly but she notices a decrease in those presently.    Patient is unknown to [facility].     Pertinent admission diagnostics:  06/27/2023 deep vein thrombosis in the left common femoral vein, femoral vein, popliteal vein, peroneal vein, and posterior tibial vein.  06/28/2023 single right lower lobe pulmonary embolism involving a subsegmental branch  06/29/2023   Ran intermittent fever, Four plex negative, + UA and was started on Cipro    SUBJECTIVE:   Patient for discharge today.  She will be discharged home.    Patient sitting in chair awake and alert.  She has walked in the room.     Still has swelling in her left leg.  Denies any issues with Eliquis at this time.      ROS:  Consitutional: energy level good, appetite good, no weight loss, no fever, no chills  HEENT: No epistaxis, no dysphagia, no gum bleeding  Cardiovascular: no chest pain, no palpitations, no orthopnea  Respiratory: No cough, no hemoptysis, no dyspnea, no pleurisy,m no wheezing  Gastrointestinal: No nausea, no vomiting, no hematemesis, no diarrhea, no constipation, no melena  Musculoskeletal: No swollen joints, no joint redness  Skin: No Rash, No nodules, no pruritus, no lesions  Neurologic: No seizures, no headache, no paresthesia  Psychiatric: no depression, no anxiety  Endocrine: no polyuria, no polydipsia    HISTORY:  Past Medical History:   Diagnosis Date    Depression      DVT (deep venous thrombosis) (CMS HCC)          Past Surgical History:   Procedure Laterality Date    HX TONSILLECTOMY      HX TUBAL LIGATION           Social History     Socioeconomic History    Marital status: Married     Spouse name: Not on file    Number of children: Not on file    Years of education: Not on file    Highest education level: Not on file   Occupational History    Not on file   Tobacco Use    Smoking status: Former     Types: Cigarettes    Smokeless tobacco: Never   Vaping Use    Vaping status: Never Used   Substance and Sexual Activity    Alcohol use: Not Currently    Drug use: Never    Sexual activity: Not on file   Other Topics Concern    Not on file   Social History Narrative    Not on file     Social Determinants of Health     Financial Resource Strain: Not on file   Transportation Needs: Low Risk  (06/28/2023)    Transportation Needs     SDOH Transportation: No   Social Connections: Low Risk  (06/28/2023)    Social Connections     SDOH Social Isolation: 5 or more times a week   Intimate Partner Violence: Not on file   Housing Stability: Not on file     Family Medical History:    None         No outpatient medications have been marked as taking for the 06/28/23 encounter [facility] Encounter).      Allergies   Allergen Reactions    Bactrim [Sulfamethoxazole-Trimethoprim] Rash    Cephalexin        PHYSICAL EXAM:    BP 101/64   Pulse 80   Temp 36.7 C (98 F)   Resp 18   Ht 1.651 m (5\' 5" )   Wt 72.6 kg (160 lb 0.9 oz)   SpO2 96%   BMI 26.63 kg/m          ECOG Status: (0) Fully active, able to carry on all predisease performance without restriction      General: Awake, alert, NAD  HEENT: Head normocephalic, atraumatic.  Mucouse membranes moist.  PERRLA, no pallor, no icterus  Adenopathy: No palpable cervical, axillary, or inguinal adenopathy noted  Neck: No JVD, Supple, no adenopathy, No JVD  Lungs: Clear to auscultation bilaterally.  No rales, rhonchi, no wheezing  Breast exam:  Right  and left breast exam completed without evidence of disease, mass or skin lesions.  Cardiovascular: Regular rate and rhythm, No murmur, no friction rub  Abdomen:  Soft, Symmetric, No tenderness, No hepatosplenomegaly, BSA x4  Extremities: + left lower extremity peripheral edema. Warm. No calf tenderness  Skin: Skin warm and dry.  No suspicious lesions  Neurologic: Alert and oriented x3. Cranial nerves II through XII intact. No focal paralysis or asterixis  Psych: Mood and affect congruent for age and gender, Denies SI/HI       DIAGNOSTIC DATA:  Results for orders placed during the hospital encounter of 06/27/23    PERIPHERAL VENOUS DUPLEX - LOWER    Interpretation Summary  Terri Bautista    RADIOLOGIST: Murray Hodgkins    EXAMINATION: PERIPHERAL VENOUS DUPLEX - LOWER    EXAM DATE/TIME:  06/27/2023 10:54 AM    CLINICAL INDICATION: M79.606: Leg pain    COMPARISON: None.    FINDINGS    There is deep venous thrombosis seen within the left common femoral vein, femoral vein, popliteal vein, peroneal vein, and posterior tibial vein. There is normal flow within the left anterior tibial vein. The right common femoral vein showed flow.    Impression  Left lower extremity DVT discussed above.    A Significant Orange actionable finding has been sent via the [employer] Actionable Findings application on 06/27/2023 11:07 AM, Message ID [id number]. Receipt of this communication will be communicated to [facility] or responsible provider and will be documented in [employer] Actionable Findings System upon receiving the acknowledgement.      Radiologist location ID: [id number]  Results for orders placed during the hospital encounter of 06/28/23    CT ANGIO CHEST FOR PULMONARY EMBOLUS W IV CONTRAST    Narrative  Terri Bautista    RADIOLOGIST: Wynema Birch, MD    CT ANGIO CHEST FOR PULMONARY EMBOLUS W IV CONTRAST performed on 06/28/2023 9:45 AM    CLINICAL HISTORY: new dvt w/ new chest pain and shob.  new dvt w/ new chest  pain and sob    TECHNIQUE: CTA imaging of the chest with intravenous contrast.  3D reconstructions.  CONTRAST:  100 ml's of Omnipaque 350    COMPARISON: None.    FINDINGS:  Hardware:  None.    Lymph nodes:   No mediastinal, hilar, or axillary lymphadenopathy.    Heart:  Normal heart size.  No pericardial effusion.  RV/LV Diameter Ratio: 0.81    Thoracic Aorta:  No thoracic aortic aneurysm or dissection.    Pulmonary Vessels:  There is a pulmonary embolism in the right lower lobe branch involving a subsegmental branch.  Most Proximal Level Of Embolus (if embolus present): Subsegmental    Lungs and Airways:  There is some atelectasis dependently.    Pleura: No pleural effusion.  No pneumothorax.    Upper Abdomen: There is fatty infiltration of the liver.    Bones: There are mild degenerative changes of the thoracic spine.    Impression  THERE IS A SINGLE RIGHT LOWER LOBE PULMONARY EMBOLISM. THE VENTRICULAR RATIO IS 0.81.    Findings of the pulmonary embolism were discussed with Dr. Katrinka Blazing at 9:58 AM on 06/28/2023.      One or more dose reduction techniques were used (e.g., Automated exposure control, adjustment of the mA and/or kV according to patient size, use of iterative reconstruction technique).      Radiologist location ID: [id number]      LABS:     Basic Metabolic Profile    Lab Results   Component Value Date/Time    SODIUM 138 07/02/2023 05:22 AM    POTASSIUM 4.3 07/02/2023 05:22 AM  CHLORIDE 103 07/02/2023 05:22 AM    CO2 27 07/02/2023 05:22 AM    ANIONGAP 8 07/02/2023 05:22 AM    Lab Results   Component Value Date/Time    BUN 9 07/02/2023 05:22 AM    CREATININE 0.72 07/02/2023 05:22 AM    GLUCOSENF 109 07/02/2023 05:22 AM          CBC  Diff   Lab Results   Component Value Date/Time    WBC 8.3 07/02/2023 05:22 AM    HGB 11.2 07/02/2023 05:22 AM    HCT 32.8 07/02/2023 05:22 AM    PLTCNT 340 07/02/2023 05:22 AM    RBC 3.84 07/02/2023 05:22 AM    MCV 85.3 07/02/2023 05:22 AM    MCHC 34.2 07/02/2023 05:22 AM     MCH 29.2 07/02/2023 05:22 AM    RDW 13.3 07/02/2023 05:22 AM    MPV 7.0 (L) 07/02/2023 05:22 AM    Lab Results   Component Value Date/Time    PMNS 66 07/02/2023 05:22 AM    LYMPHOCYTES 20 07/02/2023 05:22 AM    EOSINOPHIL 4 07/02/2023 05:22 AM    MONOCYTES 10 07/02/2023 05:22 AM    BASOPHILS 1 07/02/2023 05:22 AM    BASOPHILS 0.10 07/02/2023 05:22 AM    PMNABS 5.50 07/02/2023 05:22 AM    LYMPHSABS 1.70 07/02/2023 05:22 AM    EOSABS 0.30 07/02/2023 05:22 AM    MONOSABS 0.80 07/02/2023 05:22 AM            ASSESSMENT:    ICD-10-CM    1. Other acute pulmonary embolism without acute cor pulmonale (CMS HCC)  I26.99       2. Cervical radiculopathy  M54.12       3. Acute deep vein thrombosis of proximal leg, left (CMS HCC)  I82.4Y2       4. Acute deep vein thrombosis of lower leg, left (CMS HCC)  I82.4Z2       5. Syncope  R55 TRANSTHORACIC ECHOCARDIOGRAM - ADULT        PE/left Leg DVT:  Unprovoked.    Hypercoagulable testing ordered.   Not considered a failure on Eliquis, due to <24 hours on medication  Heparin drip started and then changed to Eliquis given with loading dose.        PLAN:   1.)Continue Eliquis 10 mg PO BID x 7 days and then decrease to 5 mg PO BID.      2.) Patient will need a minimum of 3 months of anticoagulation following discharge.  Will need follow up Left leg venous duplex for resolution of clot before stopping Eliquis.   3.) Patient educated to be diligent about monitoring for abnormal bleeding including but not limited epistaxis, hemoptysis, bright red rectal bleeding, black tarry stools, hematuria or any other abnormal bleeding. Also advised to be careful with injuries including but not limited to head injuries with or with out loss of consciousness should be evaluated to ensure no abnormal bleeding, patient verbalized understanding.   Also educated patient about travel precautions with compression socks and frequent stops walk to help prevent future blood clots.   4.)  Recommended patient get  outpatient mammogram, colonoscopy and Pap smear due to her unprovoked deep vein thrombosis/PE.  Patient has stated understanding of this.  5.) Following discharge patient will follow up with outpatient [facility] [place] in 4-6 weeks  6.)  From a hematological standpoint patient is cleared for discharge when medical team deems appropriate.     Thank  you for this consultation and allowing [place] to participate in the care of this patient.        Terri Bautista was given the chance to ask questions, and these were answered to their satisfaction. The patient is welcome to call with any questions or concerns in the meantime.     Thank you for this consultation and allowing [place] to participate in this patients care.    Patient seen independently today, Case was discussed with Primary hematologist/oncologist Dr. Melynda Keller, The above plan was advised, ordered, and implemented.     Hematology/Oncology services will be unavailable after 1700 today. We will be available again starting Monday morning at 0700. Thank you for this consultation and allowing [place] to help participate in this patients care.       Marvene Staff APRN, FNP-BC, AOCNP, 07/02/2023 , 07:55       CC:  [facility]  [address]  [place] [address]    No referring provider defined for this encounter.

## 2023-07-04 LAB — LUPUS ANTICOAGULANT
LAC NORMALIZED RATIO: 1.02 (ref <=1.20)
PATHOLOGIST INTERPRETATION LUPUS: NORMAL
SCT NORMALIZED RATIO: 1.08 (ref <=1.16)

## 2023-07-07 ENCOUNTER — Encounter (INDEPENDENT_AMBULATORY_CARE_PROVIDER_SITE_OTHER): Payer: Self-pay | Admitting: NURSE PRACTITIONER

## 2023-07-07 ENCOUNTER — Ambulatory Visit (INDEPENDENT_AMBULATORY_CARE_PROVIDER_SITE_OTHER): Payer: Self-pay | Admitting: NURSE PRACTITIONER

## 2023-07-07 NOTE — Telephone Encounter (Signed)
-----   Message from Melburn Hake sent at 07/07/2023 10:31 AM EDT -----  Regarding: FW: Clinical Question  ----- Message from Romilda Joy sent at 07/07/2023 10:15 AM EDT -----  Copied From CRM #[id number].Macomber, Deven called with a clinical question.     Pt would like to speak with someone about the swelling in her leg. Could you please call her back to discuss?    Thank you

## 2023-07-07 NOTE — Telephone Encounter (Signed)
Returned patient call and husband had a question.   She was out and at Doctor appointment yesterday and had some increased swelling.   The swelling decreased overnight but they want to know how much swelling is too much?   I advised them if she elevates the leg for several hours and the swelling does not decrease, she goes to bed in the leg does not decrease or increases in size then I would recommend that be checked.  It is good for her to be up and moving around there were always be some more swelling in that leg than the right leg.  I do recommend compression socks/stockings and patient reports that she has already ordered those.  They are welcome to call with any questions.    Marvene Staff APRN, FNP-BC, AOCNP, 07/07/2023 , 14:49

## 2023-07-09 NOTE — Care Management Notes (Signed)
Referral Information  ++++++ Placed Provider #1 ++++++  Case Manager: Tamara Holbrook  Provider Type: Home Health  Provider Name: [facility] [place]  Address:  [address] [place] [address]  Contact:    Fax:   Fax:

## 2023-07-13 ENCOUNTER — Telehealth (INDEPENDENT_AMBULATORY_CARE_PROVIDER_SITE_OTHER): Payer: Self-pay | Admitting: NURSE PRACTITIONER

## 2023-07-13 NOTE — Telephone Encounter (Signed)
Spoke to pt confirmed appt on 08-03-23 @ 8 & informed pt that she would be seeing Guss Bunde instead of French Ana. Pt stated that would be fine.

## 2023-07-28 IMAGING — MR MRI CERVICAL SPINE WITHOUT CONTRAST
4 of 5 series · 24 of 48 positions shown · IV contrast (gadolinium)
Comparison: None available.

﻿EXAM:  [id number]   MRI CERVICAL SPINE WITHOUT CONTRAST
INDICATION: Cervicalgia.
TECHNIQUE: Multiplanar multisequential MRI of the cervical spine was performed without gadolinium contrast.

[Series 5: T2 · sagittal · 3.0mm · 0.75mm/px · 8 of 13 slices shown (1 of 2)]
[im 1/13]
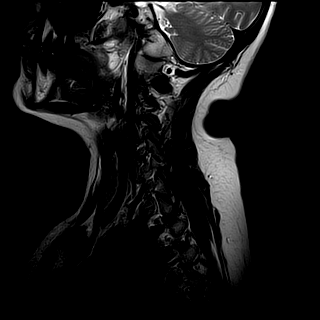
[im 2/13]
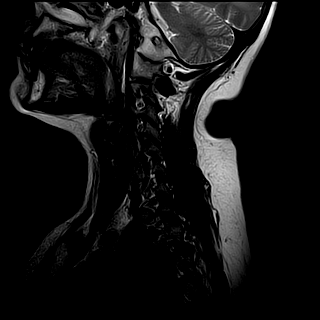
[im 4/13]
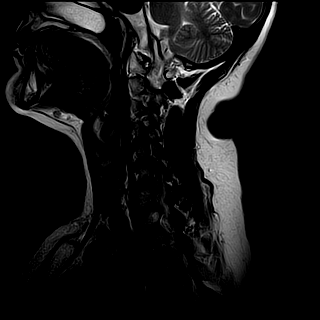
[im 6/13]
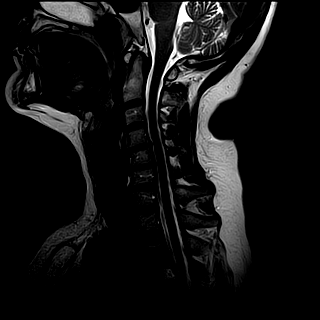
[im 7/13]
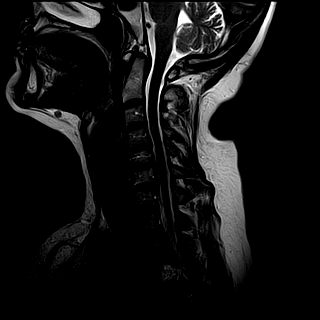
[im 9/13]
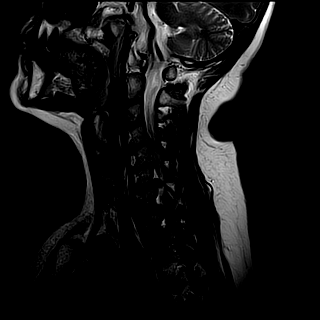
[im 11/13]
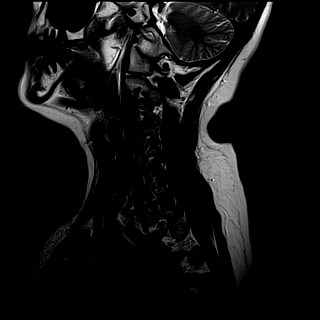
[im 13/13]
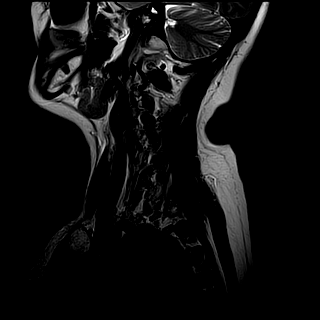

[Series 6: T1 · sagittal · 3.0mm · 0.47mm/px · 4 of 13 slices shown]
[im 1/13]
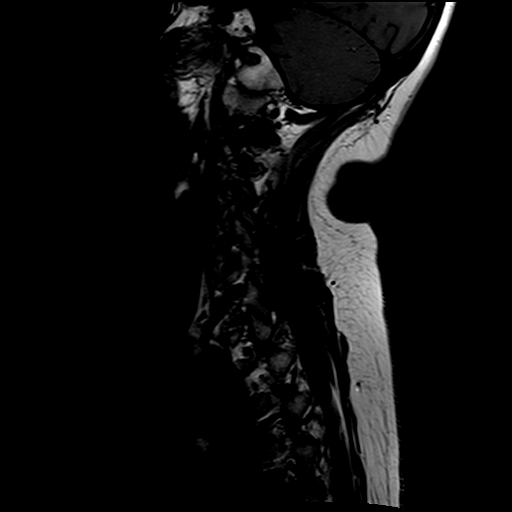
[im 2/13]
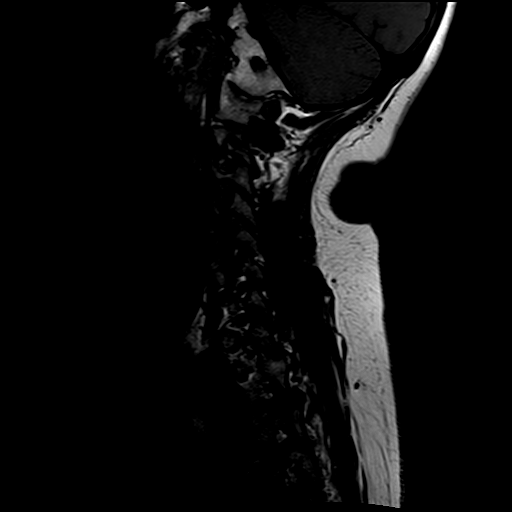
[im 7/13]
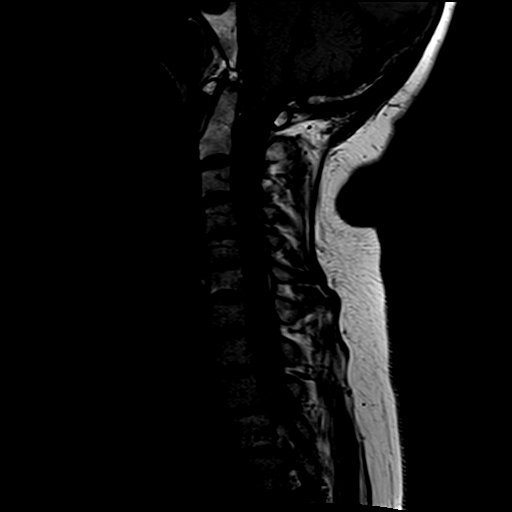
[im 11/13]
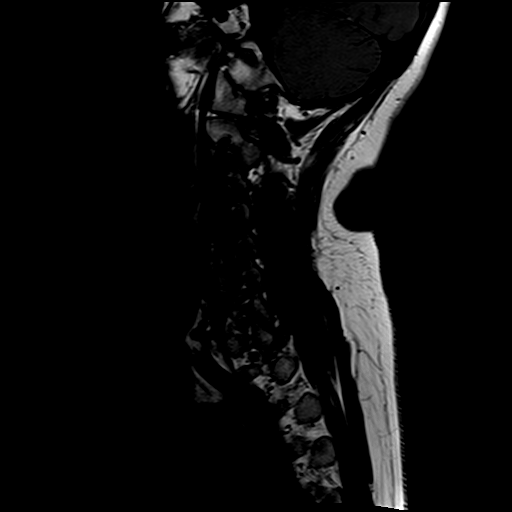

[Series 7: STIR · sagittal · 3.0mm · 0.47mm/px · 3 of 13 slices shown]
[im 2/13]
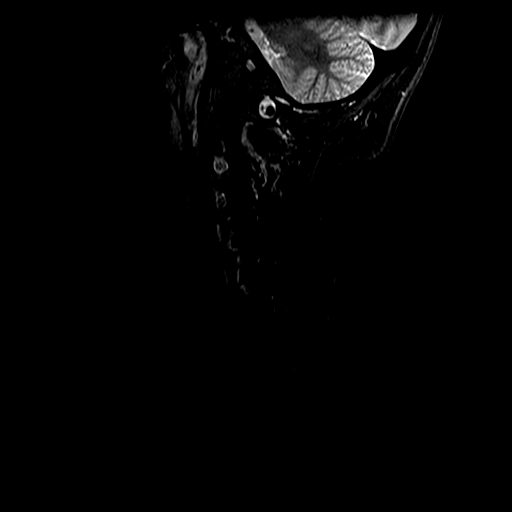
[im 7/13]
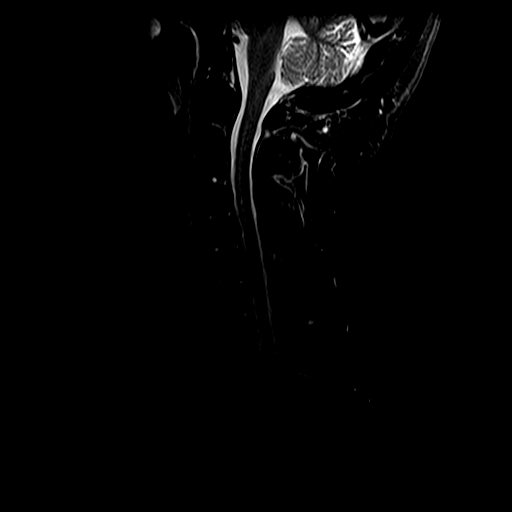
[im 11/13]
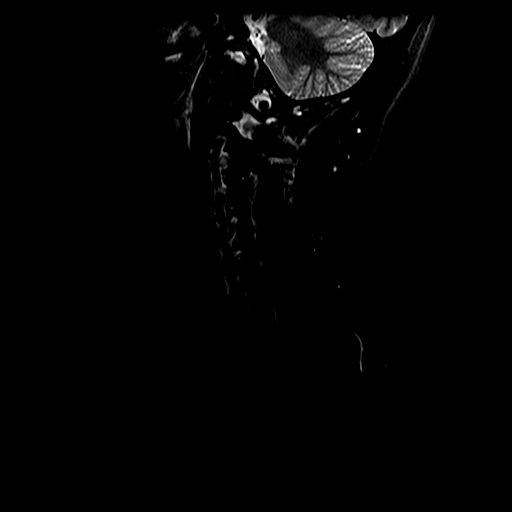

[Series 9: T2 · axial · 3.0mm · 0.39mm/px · z∈[-118,-39]mm · 9 of 18 slices shown (2 of 2)]
[im 1/18]
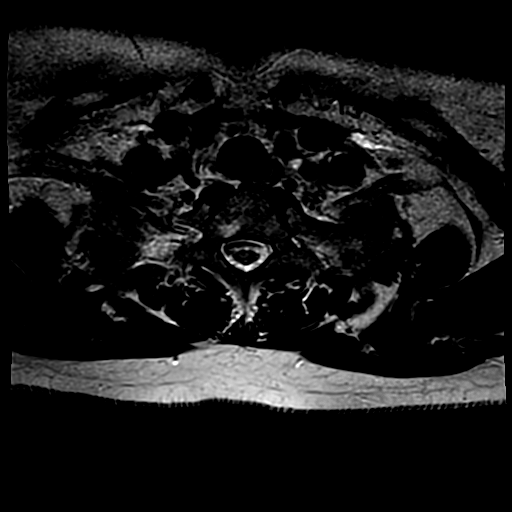
[im 4/18]
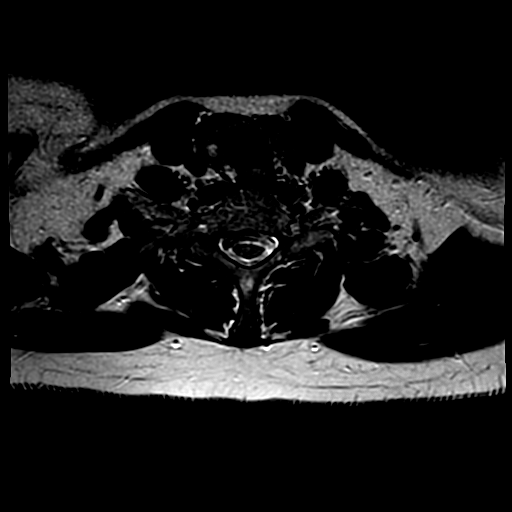
[im 5/18]
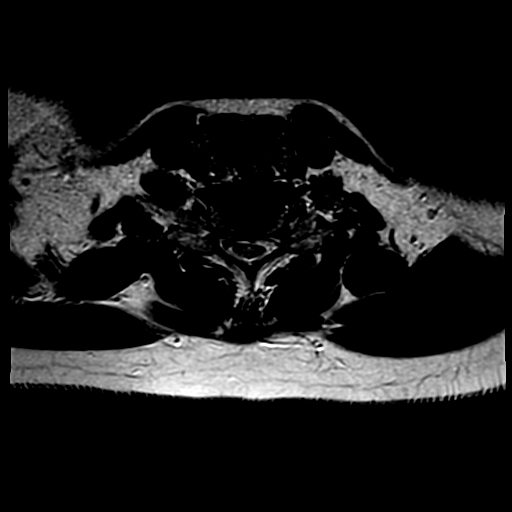
[im 8/18]
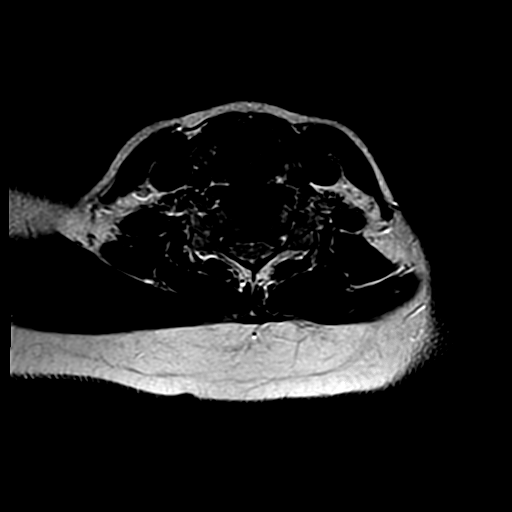
[im 10/18]
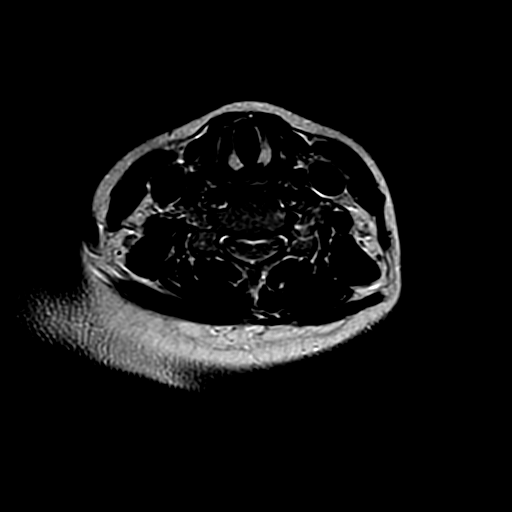
[im 13/18]
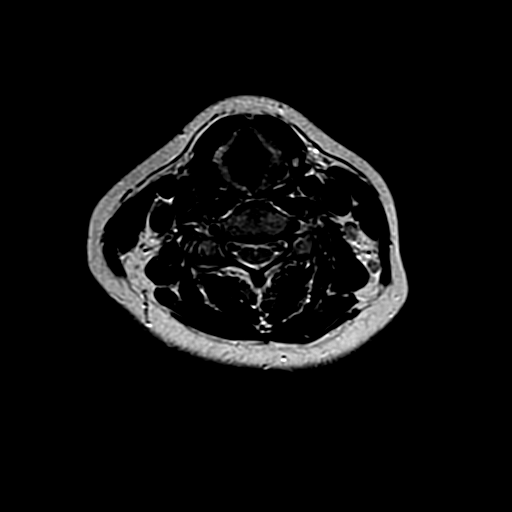
[im 14/18]
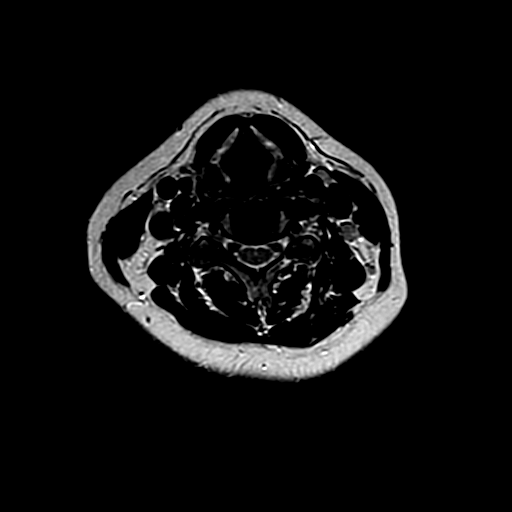
[im 16/18]
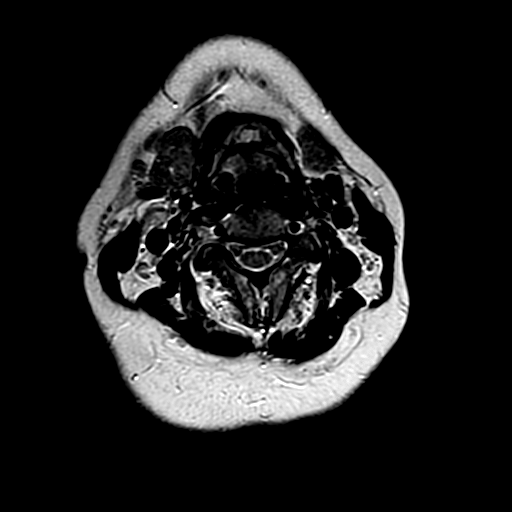
[im 18/18]
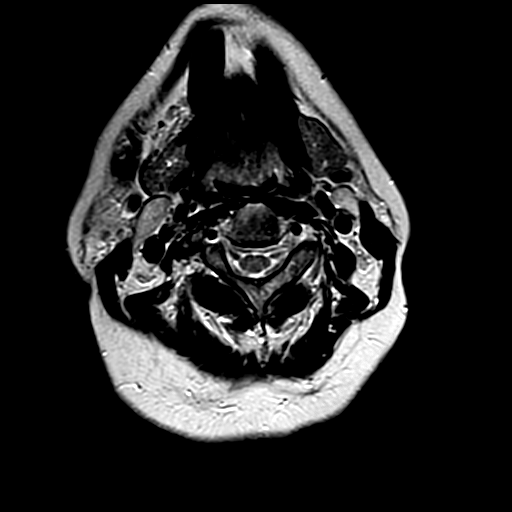

[24 of 48 positions shown; findings below may reference images not displayed]

FINDINGS: At C6-7 there is a moderate-sized left posterolateral disc herniation with probable compression of the adjacent C7 nerve root.  

No other disc herniation is seen.  There are small disc/osteophyte complexes at C4-5 and C5-6.  There is no fracture, malalignment, significant marrow signal alteration, intrinsic abnormality of the spinal cord, or a Chiari malformation.
IMPRESSION: 1. Left C6-7 disc herniation.  

2. Moderate degenerative changes.

## 2023-08-03 ENCOUNTER — Other Ambulatory Visit: Payer: Self-pay

## 2023-08-03 ENCOUNTER — Ambulatory Visit: Attending: NURSE PRACTITIONER | Admitting: NURSE PRACTITIONER

## 2023-08-03 ENCOUNTER — Encounter (INDEPENDENT_AMBULATORY_CARE_PROVIDER_SITE_OTHER): Payer: Self-pay | Admitting: NURSE PRACTITIONER

## 2023-08-03 ENCOUNTER — Inpatient Hospital Stay (INDEPENDENT_AMBULATORY_CARE_PROVIDER_SITE_OTHER): Admission: RE | Admit: 2023-08-03 | Source: Ambulatory Visit

## 2023-08-03 VITALS — BP 104/67 | HR 74 | Temp 98.0°F | Ht 65.0 in | Wt 168.3 lb

## 2023-08-03 DIAGNOSIS — I2699 Other pulmonary embolism without acute cor pulmonale: Secondary | ICD-10-CM | POA: Insufficient documentation

## 2023-08-03 DIAGNOSIS — I8290 Acute embolism and thrombosis of unspecified vein: Secondary | ICD-10-CM

## 2023-08-03 DIAGNOSIS — Z7901 Long term (current) use of anticoagulants: Secondary | ICD-10-CM | POA: Insufficient documentation

## 2023-08-03 DIAGNOSIS — I824Z2 Acute embolism and thrombosis of unspecified deep veins of left distal lower extremity: Secondary | ICD-10-CM | POA: Insufficient documentation

## 2023-08-03 MED ORDER — ELIQUIS 5 MG TABLET
5.0000 mg | ORAL_TABLET | Freq: Two times a day (BID) | ORAL | 1 refills | Status: AC
Start: 2023-08-03 — End: 2023-10-02

## 2023-08-03 NOTE — H&P (Signed)
HEMATOLOGY/ONCOLOGY, [facility],   [address]  [place], [place] [address]       New Patient History and Physical    Name: Terri Bautista MRN:  [id number]   Date: 08/03/2023 Age: 52 y.o.  DOB: 01/25/1971       Referring Physician: [facility]  Primary Care Provider: [facility]    Reason for visit/consultation:   Follow Up        History of Present Illness:  The patient is a 52 year old female that has been referred to day for the evaluation and management of hypercoagulable state.  Review of medical records indicate the patient presented [facility] ER on 06/27/[facility]23 for low back pain that radiate down her left leg, and left and lower left chest pain.  A peripheral venous duplex confirmed a left lower extremity deep vein thrombosis in the left common femoral vein, femoral vein, popliteal vein, peroneal vein and posterior tibial vein.  Further diagnostic studies of a CT of the chest showed a right lower lobe pulmonary embolism involving the segmental branch.  The VTE appear to be unprovoked as the patient denies any recent travel, smoking, prolonged immobility or recent illness, OCP or hormone use, no known trauma or no known family history of blood clots.  The patient was subsequently admitted on heparin drip transitioned to Eliquis at 10 mg b.i.d. x7 days then decreased dosage to 5 mg b.i.d, which the patient has been on since 07/08/2023.  She was seen by hematology oncology Sharion Settler FNP during her hospitalization.  The plan at time of discharge was 4 anticoagulation therapy 3 months with repeat venous Doppler of lower extremities before stopping Eliquis.  Past Medical History  Past Medical History:   Diagnosis Date    Depression     DVT (deep venous thrombosis) (CMS HCC)       Past Surgical History:   Procedure Laterality Date    HX TONSILLECTOMY      HX TUBAL LIGATION        Family Medical History:    None         Social  History  Occupation:   Social History     Occupational History    Not on file    Hometown: [place] [address]  Social History     Socioeconomic History    Marital status: Married   Tobacco Use    Smoking status: Former     Types: Cigarettes    Smokeless tobacco: Never   Vaping Use    Vaping status: Never Used   Substance and Sexual Activity    Alcohol use: Not Currently    Drug use: Never     Social Determinants of Health     Transportation Needs: Low Risk  (06/28/2023)    Transportation Needs     SDOH Transportation: No   Social Connections: Low Risk  (06/28/2023)    Social Connections     SDOH Social Isolation: 5 or more times a week       Allergies   Allergen Reactions    Bactrim [Sulfamethoxazole-Trimethoprim] Rash    Cephalexin      Current Outpatient Medications   Medication Sig    acetaminophen (TYLENOL) 325 mg Oral Tablet Take 1 Tablet (325 mg total) by mouth Every 4 hours as needed for Pain (Patient not taking: Reported on 08/03/2023)    ELIQUIS 5 mg Oral Tablet Take 1 Tablet (5 mg total) by mouth Twice  daily for 60 days    gabapentin (NEURONTIN) 300 mg Oral Capsule Take 1 Capsule (300 mg total) by mouth (Patient not taking: Reported on 08/03/2023)    HYDROcodone-acetaminophen (NORCO) 5-325 mg Oral Tablet Take 1 Tablet by mouth Every 6 hours as needed for up to 12 doses (Patient not taking: Reported on 08/03/2023)    sertraline (ZOLOFT) 100 mg Oral Tablet Take 1 Tablet (100 mg total) by mouth Once a day           ROS:   Review of Systems   Constitutional: Negative.    HENT:  Negative.     Eyes: Negative.    Respiratory: Negative.     Cardiovascular:  Positive for leg swelling (LEft leg).   Gastrointestinal: Negative.    Endocrine: Negative.    Genitourinary:  Positive for difficulty urinating.    Musculoskeletal:         Left groin pain   Skin: Negative.    Neurological: Negative.    Hematological: Negative.    Psychiatric/Behavioral: Negative.     All other systems reviewed and are negative.      Physical  Examination:  BP 104/67 (Site: Right Arm, Patient Position: Sitting, Cuff Size: Adult)   Pulse 74   Temp 36.7 C (98 F) (Temporal)   Ht 1.651 m (5\' 5" )   Wt 76.3 kg (168 lb 4.8 oz)   SpO2 94%   BMI 28.01 kg/m       Physical Exam  Vitals reviewed.   HENT:      Head: Normocephalic.      Right Ear: External ear normal.      Left Ear: External ear normal.      Nose: Nose normal.      Mouth/Throat:      Mouth: Mucous membranes are moist.      Pharynx: No oropharyngeal exudate or posterior oropharyngeal erythema.   Eyes:      General:         Right eye: No discharge.         Left eye: No discharge.      Conjunctiva/sclera: Conjunctivae normal.   Cardiovascular:      Rate and Rhythm: Normal rate and regular rhythm.      Heart sounds: No murmur heard.     No friction rub.   Pulmonary:      Breath sounds: No wheezing or rhonchi.   Abdominal:      General: Bowel sounds are normal.      Palpations: Abdomen is soft. There is no mass.      Tenderness: There is no abdominal tenderness.   Musculoskeletal:      Cervical back: Normal range of motion.      Right lower leg: No edema.      Left lower leg: Edema present.   Lymphadenopathy:      Cervical: No cervical adenopathy.   Skin:     General: Skin is warm and dry.      Capillary Refill: Capillary refill takes less than 2 seconds.      Findings: No bruising or rash.   Neurological:      Mental Status: She is alert and oriented to person, place, and time.      Motor: No weakness.      Gait: Gait normal.   Psychiatric:         Mood and Affect: Mood normal.         Behavior: Behavior normal.  Thought Content: Thought content normal.         Judgment: Judgment normal.        ECOG Status: 0 - Fully active, able to carry on all pre-disease performance without restriction.       Labs:   CBC  Diff   Lab Results   Component Value Date/Time    WBC 8.3 07/02/2023 05:22 AM    HGB 11.2 07/02/2023 05:22 AM    HCT 32.8 07/02/2023 05:22 AM    PLTCNT 340 07/02/2023 05:22 AM    RBC  3.84 07/02/2023 05:22 AM    MCV 85.3 07/02/2023 05:22 AM    MCHC 34.2 07/02/2023 05:22 AM    MCH 29.2 07/02/2023 05:22 AM    RDW 13.3 07/02/2023 05:22 AM    MPV 7.0 (L) 07/02/2023 05:22 AM    Lab Results   Component Value Date/Time    PMNS 66 07/02/2023 05:22 AM    LYMPHOCYTES 20 07/02/2023 05:22 AM    EOSINOPHIL 4 07/02/2023 05:22 AM    MONOCYTES 10 07/02/2023 05:22 AM    BASOPHILS 1 07/02/2023 05:22 AM    BASOPHILS 0.10 07/02/2023 05:22 AM    PMNABS 5.50 07/02/2023 05:22 AM    LYMPHSABS 1.70 07/02/2023 05:22 AM    EOSABS 0.30 07/02/2023 05:22 AM    MONOSABS 0.80 07/02/2023 05:22 AM            Comprehensive Metabolic Profile    Lab Results   Component Value Date    SODIUM 138 07/02/2023    POTASSIUM 4.3 07/02/2023    CHLORIDE 103 07/02/2023    CO2 27 07/02/2023    ANIONGAP 8 07/02/2023    BUN 9 07/02/2023    CREATININE 0.72 07/02/2023    ALBUMIN 3.5 07/02/2023    CALCIUM 8.8 07/02/2023    GLUCOSENF 109 07/02/2023    ALKPHOS 119 (H) 07/02/2023    ALT 37 07/02/2023    AST 17 07/02/2023    TOTBILIRUBIN 0.8 07/02/2023    TOTALPROTEIN 6.9 07/02/2023     06/30/23 Beta-2 Glycoprotein  B2GP1 IGG QUANTITATIVE  <=19 U/mL <1.4   B2GP1 IGM QUANTITATIVE  <=19 U/mL <1.5   B2GP1 IGM-QUALITATIVE  Negative Negative   B2GP1 IGG-QUALITATIVE  Negative Negative     CARDIOLIPIN IGG QUANTITATIVE  <=19 U/mL <1.6   CARDIOLIPIN IGG QUALITATIVE  Negative Negative   Comment: Autoimmune serology results must be interpreted within the clinical context.    Test performed on a [employer] BioPlex analyzer using multiplex immunoassay reagents according to manufacturer instructions.     CARDIOLIPIN IGM QUANTITATIVE  <=19 U/mL <1.5   CARDIOLIPIN IGM QUALITATIVE  Negative Negative   Comment: Autoimmune serology results must be interpreted within the clinical context.    Test performed on a [employer] BioPlex analyzer using multiplex immunoassay  reagents according to manufacturer instructions.     Specimen Collected: 06/30/23 05:27           Factor 2 ordered  inpatient was not completed     Pathology:  None to Review today     Radiology:  06/28/23 CT Chest-  There is a pulmonary embolism in the right lower lobe branch involving a subsegmental branch.  Most Proximal Level Of Embolus (if embolus present): Subsegmental    06/29/23 Strain Echo- Normal Left ventricular size, Left ventricular systolic function is normal, no wall motion abnormalities present. Normal Right ventricular size, Right ventricular function is normal.  No significant valvular heart disease.    06/27/23 Venous duplex lower extremity-  There is deep venous thrombosis seen within the left common femoral vein, femoral vein, popliteal vein, peroneal vein, and posterior tibial vein. There is normal flow within the left anterior tibial vein. The right common femoral vein showed flow.         Assessment/Plan:     Problem List Items Addressed This Visit          Cardiovascular System    Pulmonary emboli (CMS HCC)    Acute deep vein thrombosis of lower leg, left (CMS HCC) - Primary     Other Visit Diagnoses       VTE (venous thromboembolism)        Relevant Medications    ELIQUIS 5 mg Oral Tablet    Other Relevant Orders    FACTOR 5 MUTATION (LEIDEN)    FACTOR 2 (GENE MUTATION)    PERIPHERAL VENOUS DUPLEX - LOWER          Oncology History    No history exists.        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  Patient was advised to continue to use compression stocking  Increase mobility  Avoid over-the-counter supplements containing vitamin K  She will follow up in 3 months after completion of left lower extremity venous duplex      The patient was given the opportunity to ask questions, and these were answered to their satisfaction. Terri Bautista  is welcome to call with any questions or concerns at any point.        Return in about 3 months (around 11/11/2023).    On the day of the encounter, I spent a total of  57 minutes on this patient encounter including review of historical information, examination, documentation and post-visit activities.           Evie Lacks APRN, FNP-C 10/22/202408:45    You can see your note(s) in [facility]. It is common for you to encounter certain medical terminology which may be unfamiliar to you. You might see results before your provider does so please give at least 2 business days for review. Please have this understanding, that NOT all abnormal results are significant. Our office will contact you for any urgent or emergent action if necessary. If you have any questions or concerns, feel free to send a MyChart message or call the office. Please call with any new or concerning symptoms.       CC:  [facility]  [address]  [place] [address]    No primary care provider on file.    Portions of this note may be dictated using voice recognition software or a dictation service. Variances in spelling and vocabulary are possible and unintentional. Not all errors are caught/corrected. Please notify the There[facility] Parkin if any discrepancies are noted or if  the meaning of any statement is not clear.

## 2023-08-17 ENCOUNTER — Ambulatory Visit (INDEPENDENT_AMBULATORY_CARE_PROVIDER_SITE_OTHER): Admitting: Physician Assistant

## 2023-08-17 ENCOUNTER — Other Ambulatory Visit: Payer: Self-pay

## 2023-08-17 ENCOUNTER — Other Ambulatory Visit: Attending: Physician Assistant | Admitting: Physician Assistant

## 2023-08-17 VITALS — BP 109/63 | HR 74 | Ht 65.0 in | Wt 167.8 lb

## 2023-08-17 DIAGNOSIS — Z87440 Personal history of urinary (tract) infections: Secondary | ICD-10-CM

## 2023-08-17 DIAGNOSIS — R3916 Straining to void: Secondary | ICD-10-CM

## 2023-08-17 DIAGNOSIS — R3914 Feeling of incomplete bladder emptying: Secondary | ICD-10-CM

## 2023-08-17 DIAGNOSIS — R8290 Unspecified abnormal findings in urine: Secondary | ICD-10-CM

## 2023-08-17 DIAGNOSIS — N393 Stress incontinence (female) (male): Secondary | ICD-10-CM

## 2023-08-17 DIAGNOSIS — N390 Urinary tract infection, site not specified: Secondary | ICD-10-CM

## 2023-08-17 DIAGNOSIS — R3911 Hesitancy of micturition: Secondary | ICD-10-CM

## 2023-08-17 LAB — URINALYSIS, MICROSCOPIC
RBCS: 1 /[HPF] (ref <4)
SQUAMOUS EPITHELIAL: 1 /[HPF] (ref <28)
WBCS: 8 /[HPF] — ABNORMAL HIGH (ref <6)

## 2023-08-17 NOTE — Progress Notes (Signed)
UROLOGY, [facility]  [address] [place] [address]    Progress Note    Name: Terri Bautista MRN:  [id number]   Date: 08/17/2023 DOB:  [birth year] (52 y.o.)             Chief Complaint: New Patient [facility] f/u 9/16-9/20/24)  Subjective   Subjective:   Terri Bautista is a pleasant 52 y.o. White female whom presents to the clinic for sensation of difficulty urinating and recent UTI. Patient with urinary hesitancy with sensation of need to push to urinate. Patient with UTI during recent admission for deep vein thrombosis with PE.  Patient unsure if she was symptomatic at this time.  Patient denies any urinary tract infection symptoms since discharge such as urinary urgency, frequency, dysuria.  PVR 113cc. Patient with urinary incontinence, mild with laugh, cough, sneeze.   Patient denies personal or family history of urinary malignancy. Patient denies occupational chemical exposure. Patient history of  social, remote tobacco use. Patient denies fevers, chills, nausea, vomiting, hematuria, dysuria, flank pain, incontinence, dribbling,suprapubic pain, headaches, vision changes, shortness of breath, chest pain.         Objective   Objective :  BP 109/63 (Site: Right Arm, Patient Position: Sitting, Cuff Size: Adult)   Pulse 74   Ht 1.651 m (5\' 5" )   Wt 76.1 kg (167 lb 12.8 oz)   BMI 27.92 kg/m       Gen: NAD, alert  Pulm: unlabored at rest  CV: palpable pulses  Abd: soft, Nt/ND  GU: no suprapubic tenderness, no CVAT    Data reviewed:    Current Outpatient Medications   Medication Sig    acetaminophen (TYLENOL) 325 mg Oral Tablet Take 1 Tablet (325 mg total) by mouth Every 4 hours as needed for Pain (Patient not taking: Reported on 08/03/2023)    ELIQUIS 5 mg Oral Tablet Take 1 Tablet (5 mg total) by mouth Twice daily for 60 days    gabapentin (NEURONTIN) 300 mg Oral Capsule Take 1 Capsule (300 mg total) by mouth (Patient not taking: Reported on 08/03/2023)    HYDROcodone-acetaminophen (NORCO) 5-325  mg Oral Tablet Take 1 Tablet by mouth Every 6 hours as needed for up to 12 doses (Patient not taking: Reported on 08/03/2023)    sertraline (ZOLOFT) 100 mg Oral Tablet Take 1 Tablet (100 mg total) by mouth Once a day        Assessment/Plan  Problem List Items Addressed This Visit    None  Visit Diagnoses       Abnormal urinalysis    -  Primary    Relevant Orders    POCT URINE DIPSTICK    URINALYSIS, MICROSCOPIC    URINE CULTURE          Recurrent urinary tract infection  I discussed the epidemiology, pathogenesis, and prevention of recurrent acute uncomplicated cystitis and pyelonephritis of recurrent urinary tract infections with patient  We discussed that, while bothersome, there is currently no evidence linking recurrent infections to significant health problems, such as hypertension or renal disease, in the absence of anatomic or functional abnormalities of the urinary tract.  We spent the majority of our time discussing various prevention strategies, including:  Behavioral modifications:  If sexually active, avoidance of spermicides  While controlled studies have not shown post-coital voiding and liberal fluid intake to be associated with a reduced risk of recurrent UTI, these measures are unlikely to be harmful  Cranberry compounds:  While there is likely  little harmful effect other than an increase in calorie and glucose intake with cranberry juice, there remains a limited proven role; however, consideration may be given to a well-studied cranberry pill supplements such as TheraCran HP (Theralogix) which has a standardized formula and has been shown in scientific studies to promote urinary tract health  Antimicrobial prophylaxis options (continuous, postcoital, or self-start therapy) were also discussed for women who experience two or more symptomatic UTIs within six months or three or more over 12 months  We discussed balancing the degree of discomfort experienced from these infections with real concerns  about antimicrobial resistance  The prophylactic efficacy of any antimicrobial agent depends upon the continued susceptibility to the drug of potential uropathogens colonizing patient's fecal, periurethral, and vaginal flora and must be weighed against emerging uropathogen resistance patterns  Topical estrogen for postmenopausal women as a means to increase the prevalence of lactobacilli and decrease in E. coli vaginal colonization  We also discussed other potential, albeit unproven strategies to potentially reduce risk of UTI recurrence including:  Probiotics  Urinary antiseptics including methenamine salts (HiprexT, UrexT)  While generally of a low diagnostic yield, would recommend further work-up in the future including RBUS, CT scan, and cystoscopy    Sensation of urinary retention; PVR of 113 cc  Extensively discussed pathophysiology, nature, differential diagnosis of sensation of urinary retention.  If patient unable to void for greater than 6 hours then return to clinic or ER.  If patient has continued sensation of urinary retention, return to clinic for bladder scan.  Discussed with patient that medications recently used to treat left shoulder pain such as hydrocodone and Neurontin may have contributed to sensation of urinary retention.  PVR at each visit    SUI  We discussed in detail the nature and pathophysiology of bothersome stress urinary incontinence.  Given the bothersome nature of her symptoms, we discussed potential treatment options including:  Conservative management, including pelvic floor "Kegel" exercises, which is generally reserved for mild cases  Estrogen replacement therapy either primarily or as an adjunct, when not contraindicated, to restore blood flow to the female pelvic organs to aid in preserving vascularity to her pelvic floor  Urethral bulking agents  Synthetic midurethral sling delivered either a retropubic or transobturator approach  Bladder neck sling utilizing autologous  fascia  We also discussed the incidence, nature and risks of de novo urge urinary incontinence following treatment of stress urinary incontinence, particularly following midurethral sling      Aniket Paye, PA-C

## 2023-08-18 ENCOUNTER — Telehealth (INDEPENDENT_AMBULATORY_CARE_PROVIDER_SITE_OTHER): Payer: Self-pay | Admitting: NURSE PRACTITIONER

## 2023-08-18 NOTE — Telephone Encounter (Signed)
Left pt a voicemail confirmed lab appt on 10-22-2023 per [place] labs need to be done 2 wks prior to appt.

## 2023-08-19 LAB — URINE CULTURE: URINE CULTURE: 5000 — AB

## 2023-08-20 ENCOUNTER — Other Ambulatory Visit: Payer: Self-pay

## 2023-08-25 ENCOUNTER — Ambulatory Visit
Admission: RE | Admit: 2023-08-25 | Discharge: 2023-08-25 | Disposition: A | Discharge status: Still In Hospital | Source: Ambulatory Visit

## 2023-08-25 ENCOUNTER — Other Ambulatory Visit: Payer: Self-pay

## 2023-08-25 ENCOUNTER — Encounter (HOSPITAL_COMMUNITY): Payer: Self-pay

## 2023-08-25 ENCOUNTER — Encounter (INDEPENDENT_AMBULATORY_CARE_PROVIDER_SITE_OTHER): Payer: Self-pay | Admitting: NURSE PRACTITIONER

## 2023-08-25 ENCOUNTER — Other Ambulatory Visit (HOSPITAL_COMMUNITY): Payer: Self-pay

## 2023-08-25 DIAGNOSIS — M5412 Radiculopathy, cervical region: Secondary | ICD-10-CM

## 2023-08-25 NOTE — PT Evaluation (Signed)
[facility] Medicine [facility]  Outpatient Physical Therapy  [address]  [place], [address]  [phone]  (Fax) [phone]      Physical Therapy Cervical Evaluation    Date: 08/25/2023  Patient's Name: Terri Bautista  Date of Birth: January 19, 1971  Physical Therapy Evaluation     Evaluating Physical Therapist: Marcell Anger, PT, DPT  PT diagnosis/Reason for Referral: cervical radiculopathy  Next Scheduled Physician Appointment: 6-8 weeks  Allergies/Contraindications: n/a      SUBJECTIVE  Date of onset: 6 months    Mechanism of injury: gradual onset    Current Presentation: Initially had soreness in the neck maybe from sleeping wrong.  Pain now radiates down the side of the neck and down the entire left arm.  Radiates down to the index and middle finger mainly.  Symptoms are worst at nighttime.  She also experiences reproduction of symptoms with cervical movements, especially left rotation,    PLOF: No issues prior to 6 months ago.      Past Medical History:   Past Medical History:   Diagnosis Date    Depression     DVT (deep venous thrombosis) (CMS HCC)          Past Surgical History:   Past Surgical History:   Procedure Laterality Date    HX TONSILLECTOMY      HX TUBAL LIGATION         Previous episodes/treatments: none    Medications for this problem:  OTC Tylenol every once in awhile    Diagnostic tests:   Xray: straightening of the cervical   spine with focal kyphosis centered around C5 extending from C4-C6.  There   is significant disc height loss and mild facet arthrosis primarily at   C4-C5 C5-C6 and C6-C7.  There are small anterior marginal osteophytes at   these levels as well.  There is bilateral foraminal narrowing on the   oblique radiographs at C4-C5 and C5-C6.  There is no spondylolisthesis or   scoliosis.     Patient goals: REDUCE PAIN and NORMALIZE FUNCTION    Occupation:  [employer] operational officer    Next MD visit: 6-8 weeks from now    Pain location: neck and left arm                    Pain  description: SHARP, STABBING, TINGLING, and throbbing    Pain frequency:  CONTINUOUS    Pain rating: Now 3   Best 3   Worst 10    Radiculopathy: LUE    Pain increases with: POSITION CHANGE, ADLs, ACTIVITY, BENDING, LIFTING, and trying to sleep at night            decreases with : HEAT    Sensation: Altered into the index and middle finger on the left    Weakness: LUE    Sleep affected: Difficulty getting comfortable going to sleep    Headaches: Not related to this problem that started 6 months    Dizziness: "blacked out" in May.  History of blood clots 2 months ago now on Eliquis     Subjective Functional Reports:    Sitting: LIMITED    Standing: LIMITED    Walking: LIMITED    Lifting: LIMITED      Patient-Specific Functional Score:    Problem Score   1. Sleeping 0   2. Washing hair 4   3. Anything with house ADLs requiring using the LUE 4   Total score = sum of  the activity scores/number of activities    Minimal detectable change (90% CI) for avg score = 2 points    Minimal detectable change (90% CI) for single activity score = 3 points  2.6         OBJECTIVE    BP:128/90  SpO2: 94%, 65 HR    ROM   right left   rotation 20 20   sidebend 15 14   flexion 20 -------------------------   extension 20 -------------------------     Cervical AROM: painful all direction, LROT caused tingling down the left arm  Seated shoulder AROM: 135 flexion bilaterally, 117 abduction bilaterally, 28 ER in neutral (49 on the right)    Strength     right left   Shrug (C3-4) 5 5   Shoulder abduction (C5) 3+ 3+   Elbow flexion (C6) 5 4-   Elbow extension (C7) 4- 4-   Wrist extension (C6) 5 3+   Wrist flexion (C7) 4 4-   Thumb extension (C8) 4 4-   Finger abduction (T1) 4+ 3+     JAMAR L: 52#  JAMAR R: 60#    Palpation: Suboccipitals, left upper trap    Posture: DECREASED LORDOSIS    Reflexes    Biceps 3   Triceps 3   Brachioradialis 3   Dermatomes: decreased sensation to all dermatomes on the LUE    Special tests   +Hoffman's  (-)  Babinski's    +Spurling's  Distraction test caused significant pain and paresthesias down the left arm     Treatment provided:REVIEW OF POC AND GOALS WITH PATIENT, ALL QUESTIONS ANSWERED and PATIENT EDUCATION      ASSESSMENT    Impression: [name] presents to OPPT with LUE radicular symptoms.  She exhibits significantly reduced cervical AROM, poor response to cervical distraction, +Hoffman's test, and decreased shoulder strength/ROM and grip strength.  She would benefit from PT to address these deficits in order to return to house ADLs without limiting arm pain.    Rehab potential: GUARDED    Goals:    Short-Term Goals: (3 Weeks):     - Patient will demonstrate improved cervical AROM to at least 50 degrees in sagittal plane to aid in completion of ADLs.     -  Patient will demonstrate independence with progressive HEP to maximize gains from PT.      - Patient will report max 6/10 pain to aid in completion of ADLs/work duties.           Long-Term Goals: (6 Weeks):     - Patient will demonstrate improved L UE strength of at least 4/5 to aid in completion of ADLs.     - Patient will demonstrate improved cervical AROM to at least 70 degrees in sagittal plane to aid in completion of ADLs.     -  Patient will demonstrate 10 overhead presses with 3# and good form to aid in completion of ADLs and IADLs.     -  Patient will report max 3/10 pain to aid in completion of ADLs/work duties.     -  Patient will demonstrate improved functional ability in daily life via improved Patient Specific Functional score of at least 6.             PLAN  Patient will attend 2 times per week x 6 weeks. Therapy may include, but is not limited to THERAPEUTIC EXERCISES, MYOFASCIAL/JOINT MOBILIZATION, POSTURE/BODY MECHANICS, ERGONOMIC TRAINING, TRANSFER/GAIT TRAINING, HOME INSTRUCTIONS, HEAT/COLD, ULTRASOUND, ELECTRICAL STIMULATION, and KINESIOTAPE  Plan for next visit:       Evaluation complexity:   Personal factors impacting POC: FREQUENT  OR CHRONIC PAIN, PRE-EXISTING FUNCTIONAL LIMITATIONS, and OCCUPATIONAL ADLS (IE HEAVY LIFTING, REPETITIVE TASKS, LONG HOURS)   Co-morbidities impacting POC:  history of blood clot  Complexity of physical exam: INCLUDING MUSCULOSKELETAL SYSTEM (POSTURE, ROM, STRENGTH, HEIGHT/WEIGHT), INCLUDING NEUROMUSCULAR EXAM (BALANCE, GAIT, LOCOMOTION, MOBILITY), INCLUDING CARDIOVASCULAR PULMONARY FUNCTION (HR, RR, BP, EDEMA), and INCLUDING ACTIVITY/MOBILITY RESTRICTIONS   Clinical Presentation: EVOLVING WITH CHANGING CLINICAL CHARACTERISTICS   Evaluation Complexity: MODERATE-HISTORY 1-2, EXAMINATION 3+, PRESENTATION  EVOLVING/CHANGING        Total Session Time 44 and Untimed code minutes 44        Intervention minutes: EVALUATION 44 minutes    Cornisha Zetino, PT  08/25/2023, 16:59                Certification:    From:______  Through:_________    I certify the need for these services furnished under this plan of treatment and while under my care.    Referring Provider Signature: _______________     Date : _____________________

## 2023-08-26 ENCOUNTER — Other Ambulatory Visit (HOSPITAL_COMMUNITY): Payer: Self-pay

## 2023-08-26 DIAGNOSIS — M25512 Pain in left shoulder: Secondary | ICD-10-CM

## 2023-09-01 ENCOUNTER — Other Ambulatory Visit: Payer: Self-pay

## 2023-09-01 ENCOUNTER — Ambulatory Visit (HOSPITAL_COMMUNITY)
Admission: RE | Admit: 2023-09-01 | Discharge: 2023-09-01 | Disposition: A | Discharge status: Still In Hospital | Source: Ambulatory Visit

## 2023-09-01 NOTE — PT Treatment (Signed)
[facility] Medicine [facility]  Outpatient Physical Therapy  [address]  [place], [address]  [phone]  (Fax) [phone]    Physical Therapy Treatment Note    Date: 09/01/2023  Patient's Name: Terri Bautista  Date of Birth: 1971/09/26  Physical Therapy Visit            Visit #/POC: 2 of 12  Authorization: [employer]  POC Signed?: -  POC Ends: 12/25  Order Ends: 12/25  Next Progress Note Due: 8-10th visit      Evaluating Physical Therapist: Marcell Anger, DPT  PT diagnosis/Reason for Referral: cervical radiculopathy  Next Scheduled Physician Appointment: 6-8 weeks  Allergies/Contraindications: n/a          Subjective: Pt has no new reports to mention today. Still has constant L UE tingling.     Objective: There ex per flow sheet for postural strengthening    Measured ROM: no measurements taken today  EXERCISE/ACTIVITY NAME REPETITIONS RESISTANCE COMPLETED THIS DOS   Very gentle manual cervical traction     Y- increase symptoms   Subocciptal release     Y-increase symptoms   Scapular retraction   10 red Y   Low row   10 red Y   Horizontal abduction    5 yellow                                          Assessment: Pt has increased symptoms with very gentle manual therapies. Appreciates extremely weak postural muscles with postural strengthening exercises.  Short-Term Goals: (3 Weeks):     - Patient will demonstrate improved cervical AROM to at least 50 degrees in sagittal plane to aid in completion of ADLs.     -  Patient will demonstrate independence with progressive HEP to maximize gains from PT.      - Patient will report max 6/10 pain to aid in completion of ADLs/work duties.           Long-Term Goals: (6 Weeks):     - Patient will demonstrate improved L UE strength of at least 4/5 to aid in completion of ADLs.     - Patient will demonstrate improved cervical AROM to at least 70 degrees in sagittal plane to aid in completion of ADLs.     -  Patient will demonstrate 10 overhead presses with 3#  and good form to aid in completion of ADLs and IADLs.     -  Patient will report max 3/10 pain to aid in completion of ADLs/work duties.     -  Patient will demonstrate improved functional ability in daily life via improved Patient Specific Functional score of at least 6.      Plan: Continue as tolerated    Total Session Time 30 and Timed code minutes 30  THERAPEUTIC EXERCISE 30 minutes      Markeria Goetsch, PTA  09/01/2023, 17:35

## 2023-09-03 ENCOUNTER — Ambulatory Visit (HOSPITAL_COMMUNITY)
Admission: RE | Admit: 2023-09-03 | Discharge: 2023-09-03 | Disposition: A | Discharge status: Still In Hospital | Source: Ambulatory Visit

## 2023-09-03 ENCOUNTER — Other Ambulatory Visit: Payer: Self-pay

## 2023-09-03 NOTE — PT Treatment (Signed)
[facility] Medicine [facility]  Outpatient Physical Therapy  [address]  [place], [address]  [phone]  (Fax) [phone]    Physical Therapy Treatment Note    Date: 09/03/2023  Patient's Name: Terri Bautista  Date of Birth: 06-Jul-1971  Physical Therapy Visit      Visit #/POC: 3 of 12  Authorization: [employer]  POC Signed?: -  POC Ends: 12/25  Order Ends: 12/25  Next Progress Note Due: 8-10th visit        Evaluating Physical Therapist: Marcell Anger, DPT  PT diagnosis/Reason for Referral: cervical radiculopathy  Next Scheduled Physician Appointment: 6-8 weeks  Allergies/Contraindications: n/a              Subjective: Patient reports increased tingling in the 4th and 5th digits after last session.  "Not bad" this morning at 3/10      Objective: There ex per flow sheet for postural strengthening  Seated sustained cervical retraction caused peripheralization of symptoms within 5 seconds  Seated sustained cervical flexion did not cause peripheralization of symptoms  Seated sustained cervical extension caused isolated discomfort at the lower c-spine but no perhiperalization of symptoms    DTRs C5 C6 C7 remain hyper reflexive at 3+  +Shimizu reflex and +Hoffman's test.  Cletus Bautista tests caused shoulder abduction reflex  Inverted supintator sign is hyper reflexive causing elbow and wrist flexion vs elbow extension     Measured ROM: no measurements taken today  EXERCISE/ACTIVITY NAME REPETITIONS RESISTANCE COMPLETED THIS DOS   Very gentle manual cervical traction        N- increase symptoms   Subocciptal release        N increase symptoms   Scapular retraction    10 red Y   Low row    10 red Y   Horizontal abduction     5 yellow      Seated dowel held at 90 degrees flexion with neutral c-spine and active thoracic rotation    15   Y    Prone cervical extension on elbows    12  AROM Y    Prone cervical PA mobilizations    Grade 2-3   Y                         Assessment: Continues with poor tolerance to  cervical therex and manual therapy.  Patient instructed to bring her MRI report next session as reflexes and symptoms are not consistent with cervical radiculopathy.    Short-Term Goals: (3 Weeks):     - Patient will demonstrate improved cervical AROM to at least 50 degrees in sagittal plane to aid in completion of ADLs.     -  Patient will demonstrate independence with progressive HEP to maximize gains from PT.      - Patient will report max 6/10 pain to aid in completion of ADLs/work duties.           Long-Term Goals: (6 Weeks):     - Patient will demonstrate improved L UE strength of at least 4/5 to aid in completion of ADLs.     - Patient will demonstrate improved cervical AROM to at least 70 degrees in sagittal plane to aid in completion of ADLs.     -  Patient will demonstrate 10 overhead presses with 3# and good form to aid in completion of ADLs and IADLs.     -  Patient will report max 3/10 pain to aid  in completion of ADLs/work duties.     -  Patient will demonstrate improved functional ability in daily life via improved Patient Specific Functional score of at least 6.      Plan: Continue based on MRI    Total Session Time 32 and Timed code minutes 32  THERAPEUTIC EXERCISE 32 minutes      Anahla Bevis, PT  09/03/2023, 09:28

## 2023-09-07 ENCOUNTER — Other Ambulatory Visit: Payer: Self-pay

## 2023-09-07 ENCOUNTER — Ambulatory Visit
Admission: RE | Admit: 2023-09-07 | Discharge: 2023-09-07 | Disposition: A | Discharge status: Still In Hospital | Source: Ambulatory Visit

## 2023-09-07 NOTE — PT Treatment (Addendum)
[facility] Medicine [facility]  Outpatient Physical Therapy  [address]  [place], [address]  [phone]  (Fax) [phone]    Physical Therapy Treatment Note    Date: 09/07/2023  Patient's Name: Terri Bautista  Date of Birth: 1971/01/07  Physical Therapy Visit      Visit #/POC: 4 of 12  Authorization: [employer]  POC Signed?: -  POC Ends: 12/25  Order Ends: 12/25  Next Progress Note Due: 8-10th visit        Evaluating Physical Therapist: Marcell Anger, DPT  PT diagnosis/Reason for Referral: cervical radiculopathy  Next Scheduled Physician Appointment: 6-8 weeks  Allergies/Contraindications: n/a         DISCHARGE NOTE  Patient has not followed up with PT during holding period and will be discharged at this time.  Interventions included therex and manual therapy for the treatment of cervical radiculopathy.  She attended 4 sessions of the 12 total POC.  Date of service: 08/25/23 - 09/07/23.     Subjective: Patient reports decent weekend but this morning had significantly increased symptoms down the entire left arm into the thumb and index finger.  Reports intensity of symptoms at 5/10     Objective: There ex per flow sheet for postural strengthening    DTRs C5 C6 C7 remain hyper reflexive at 3+  +Shimizu reflex and +Hoffman's test.  Cletus Gash tests caused shoulder abduction   Inverted supintator sign is hyper reflexive causing elbow and wrist flexion vs elbow extension  Dermatomes: decreased C6, C7, C8 and T1 on the left    Rhomberg eyes open: initially started with good balance but progressed to significant sway within 15 seconds  Heel walking; increased low back pain, antalgic and decreased  Toe Walking: did not increase back pain but only about an inch of clearance from the floor.    Manual cervical traction caused significantly increased symptoms to 8/10 down the left arm     Measured ROM: no measurements taken today  EXERCISE/ACTIVITY NAME REPETITIONS RESISTANCE COMPLETED THIS DOS   Very gentle  manual cervical traction        N- increase symptoms   Subocciptal release        N increase symptoms   Scapular retraction    10 red Y   Low row    10 red Y   Horizontal abduction     5 yellow      Seated dowel held at 90 degrees flexion with neutral c-spine and active thoracic rotation    15   Y    Prone cervical extension on elbows    12  AROM Y    Prone cervical PA mobilizations    Grade 2-3   Y                         Assessment: Patient continues to exhibit upper motor neuron signs, including +Hoffman's, hyperreflexia, +Shimizu reflex, +Rhomberg, and is warranted for further investigation from referring physician at this time.  She has had significantly poor tolerance to conservative exercises and manual therapy.     Short-Term Goals: (3 Weeks):     - Patient will demonstrate improved cervical AROM to at least 50 degrees in sagittal plane to aid in completion of ADLs.     -  Patient will demonstrate independence with progressive HEP to maximize gains from PT.      - Patient will report max 6/10 pain to aid in completion of ADLs/work  duties.           Long-Term Goals: (6 Weeks):     - Patient will demonstrate improved L UE strength of at least 4/5 to aid in completion of ADLs.     - Patient will demonstrate improved cervical AROM to at least 70 degrees in sagittal plane to aid in completion of ADLs.     -  Patient will demonstrate 10 overhead presses with 3# and good form to aid in completion of ADLs and IADLs.     -  Patient will report max 3/10 pain to aid in completion of ADLs/work duties.     -  Patient will demonstrate improved functional ability in daily life via improved Patient Specific Functional score of at least 6.      Plan: Holding PT 4 weeks pending further investigation of upper motor neuron signs.    Total Session Time 26 and Timed code minutes 26  THERAPEUTIC EXERCISE 26 minutes      Tucker Minter, PT  09/07/2023, 17:08

## 2023-09-08 ENCOUNTER — Emergency Department
Admission: EM | Admit: 2023-09-08 | Discharge: 2023-09-08 | Disposition: A | Discharge status: Home / Self Care | Attending: Emergency Medicine | Admitting: Emergency Medicine

## 2023-09-08 ENCOUNTER — Encounter (HOSPITAL_COMMUNITY): Payer: Self-pay | Admitting: Emergency Medicine

## 2023-09-08 ENCOUNTER — Inpatient Hospital Stay (HOSPITAL_COMMUNITY)

## 2023-09-08 ENCOUNTER — Other Ambulatory Visit: Payer: Self-pay

## 2023-09-08 DIAGNOSIS — M5412 Radiculopathy, cervical region: Secondary | ICD-10-CM | POA: Insufficient documentation

## 2023-09-08 MED ORDER — PREDNISONE 20 MG TABLET
ORAL_TABLET | ORAL | Status: AC
Start: 2023-09-08 — End: 2023-09-08
  Filled 2023-09-08: qty 1

## 2023-09-08 MED ORDER — PREDNISONE 20 MG TABLET
20.0000 mg | ORAL_TABLET | Freq: Three times a day (TID) | ORAL | 0 refills | Status: AC
Start: 2023-09-08 — End: 2023-09-13

## 2023-09-08 MED ORDER — ASPIRIN 81 MG CHEWABLE TABLET
324.0000 mg | CHEWABLE_TABLET | ORAL | Status: DC
Start: 2023-09-08 — End: 2023-09-08

## 2023-09-08 MED ORDER — ASPIRIN 81 MG CHEWABLE TABLET
CHEWABLE_TABLET | ORAL | Status: AC
Start: 2023-09-08 — End: 2023-09-08
  Filled 2023-09-08: qty 4

## 2023-09-08 MED ORDER — PREDNISONE 20 MG TABLET
20.0000 mg | ORAL_TABLET | ORAL | Status: AC
Start: 2023-09-08 — End: 2023-09-08
  Administered 2023-09-08: 20 mg via ORAL

## 2023-09-08 NOTE — ED Triage Notes (Addendum)
Pt believes she turned a certain way, and has increased pain left shoulder, blade and back. Pt seen by neuro at [facility], waiting for response. Pt has PE in left lung, treated in Sept. Denies issues with gallbladder. Hurts more with movement. Pt recently seen by Physical therapist and she believes it may have caused a flare up. Pt takes Eliquis, took Norco earlier this evening.

## 2023-09-08 NOTE — Discharge Instructions (Signed)
Take prednisone 20 mg 3 times a day for the next 5 days  Follow up with the your family doctor for recheck on Monday/Tuesday.  You will need referral to neurosurgery  Return to emergency room for any increased pain, numbness/tingling, or any concerns

## 2023-09-08 NOTE — ED Nurses Note (Signed)
Pt ambulated out of ED with discharge instructions and belongings

## 2023-09-08 NOTE — ED Provider Notes (Signed)
[place] Medicine [facility]  ED Primary Provider Note        Arrival: The patient arrived by Car     History of Present Illness   chief complaint  Terri Bautista is a 52 y.o. female who had concerns including Shoulder Pain.  Patient 52 year old female presents emergency room with a chief complaint of chronic left shoulder pain originates in the left side of her neck radiates up into her shoulder down into her left arm.  Is has been ongoing for several months.  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  Patient is gone to physical therapy that has had increased discomfort.  Is good sensation over all dermatomes but intermittent paresthesias.  Is does have painful range of motion elevation of the left shoulder and abduction/flexion.  Patient rates the pain as 9/10.  All nursing notes reviewed        Review of Systems     No other overt Review of Systems are noted to be positive except noted in the HPI.      Historical Data   History Reviewed This Encounter: Medical History  Surgical History  Family History  Social History      Physical Exam   ED Triage Vitals [09/08/23 2136]   BP (Non-Invasive) 129/78   Heart Rate 70   Respiratory Rate 18   Temperature 36.5 C (97.7 F)   SpO2 97 %   Weight 74.8 kg (165 lb)   Height 1.651 m (5\' 5" )         Exam:   Constitutional:  Patient alert orient x3 in no apparent distress.  No limitations.  Head: Atraumatic normocephalic  Eyes :  Pupils are equal round reactive to light and accommodation extraocular muscles are intact.  Sclera and conjunctiva are unremarkable  Ears:  Tympanic membranes are pearly gray bilaterally; external auditory canals are unremarkable; external ears without any lesions  Nose:  Nares are patent turbinates are pink and moist  Mouth:  Mucosa is pink and moist without lesions.  Posterior pharynx is pink and moist without hypertrophy/exudate.  Neck:  Neck is soft and supple  without palpable lymphadenopathy.  Is does have increased pain with rotation of head to the right.  Pain is primarily over the left side of the neck.    Heart:  Regular rate and rhythm without audible murmur  Lungs:  Clear to auscultation bilaterally without any wheezing/rales/rhonchi  Abdomen:  Soft nontender without any rebound or guarding; positive bowel sounds throughout  Genitalia:  Deferred  Skin:  Warm and dry without lesions.  Normal skin turgor.  Brisk capillary refill distally  Extremities:  Good strength bilaterally with full range of motion of upper and lower extremities.  Patient does have painful range of motion of the left shoulder with the paresthesias.  Symptoms do improve with the placing arm up over the top of head.  Neuro:  Alert oriented x3.  Cranial nerves II-XII grossly intact as tested.  Excellent sensation distally over all dermatomes.  Psychiatric:  Patient cooperative, affect appropriate, insight and judgment good          Procedures           Patient Data   Labs Ordered/Reviewed - No data to display    No orders to display       Medical Decision Making          Medical  Decision Making  I did review MRI report that patient has from [facility] in [place].  Patient does have herniation of disc C6/C7 with some compression of the left C7 nerve root.  This has consistent with the patient findings during physical exam.  It is consistent with a diagnosis of cervical radiculopathy.  Patient does have improvement of symptoms with elevating arm up over the top of head.  They did discuss treatment options with the patient.  We will start her on prednisone 20 mg p.o. t.i.d. x5 days.  We will have her follow up with her primary care provider/ortho for referral to neurosurgery.    Amount and/or Complexity of Data Reviewed  External Data Reviewed: radiology.     Details: See medical decision making above    Risk  Prescription drug management.    Critical Care  Total time providing critical  care: 0 minutes        ED Course as of 09/08/23 2208   Wed Sep 08, 2023   2139 EKG shows normal sinus rhythm with a heart rate of 72, normal axis, normal R-wave progression, no ectopy, no ischemia, normal PR/QRS interval.         Medications Administered in the ED   predniSONE (DELTASONE) tablet (has no administration in time range)       Following the history, physical exam, and ED workup, the patient was deemed stable and suitable for discharge. The patient/caregiver was advised to return to the ED for any new or worsening symptoms. Discharge medications, and follow-up instructions were discussed with the patient/caregiver in detail, who verbalizes understanding. The patient/caregiver is in agreement and is comfortable with the plan of care.    Disposition: Discharged         Current Discharge Medication List        START taking these medications.        Details   predniSONE 20 mg Tablet  Commonly known as: DELTASONE   20 mg, Oral, 3 TIMES DAILY  Qty: 15 Tablet  Refills: 0            CONTINUE these medications - NO CHANGES were made during your visit.        Details   acetaminophen 325 mg Tablet  Commonly known as: TYLENOL   325 mg, EVERY 4 HOURS PRN  Refills: 0     Eliquis 5 mg Tablet  Generic drug: apixaban   5 mg, Oral, 2 TIMES DAILY  Qty: 60 Tablet  Refills: 1     gabapentin 300 mg Capsule  Commonly known as: NEURONTIN   300 mg  Refills: 0     HYDROcodone-acetaminophen 5-325 mg Tablet  Commonly known as: NORCO   1 Tablet, Oral, EVERY 6 HOURS PRN  Qty: 12 Tablet  Refills: 0     sertraline 100 mg Tablet  Commonly known as: ZOLOFT   100 mg, Oral, DAILY  Refills: 0            Follow up:   Follow up with the family doctor for recheck on Monday/Tuesday for referral to neurosurgery                       Clinical Impression   Cervical radiculopathy at C7 (Primary)         Current Discharge Medication List        START taking these medications    Details   predniSONE (DELTASONE) 20 mg Oral Tablet Take 1 Tablet (20  mg  total) by mouth Three times a day for 5 days  Qty: 15 Tablet, Refills: 0             R.A. Santiago Bumpers, DO  Department of Emergency Medicine

## 2023-09-09 DIAGNOSIS — R079 Chest pain, unspecified: Secondary | ICD-10-CM

## 2023-09-09 LAB — ECG 12 LEAD
Atrial Rate: 72 {beats}/min
Calculated P Axis: 43 degrees
Calculated R Axis: 68 degrees
Calculated T Axis: 64 degrees
PR Interval: 144 ms
QRS Duration: 80 ms
QT Interval: 392 ms
QTC Calculation: 429 ms
Ventricular rate: 72 {beats}/min

## 2023-09-10 ENCOUNTER — Ambulatory Visit (HOSPITAL_COMMUNITY): Payer: Self-pay

## 2023-10-21 NOTE — Progress Notes (Signed)
Cervical Consult Note:      HPI: Terri Bautista is a 53 y.o. female patient who c/o neck and left arm pain since May/April of last year.  Started having muscle spasms, was seen by ortho for rotator cuff.  [facility]  Went to PT, made it worse.  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  Has neck pain/tingling in left arm and into digits 2/3.  Has had IM steroid injections     Chief Complaint   Patient presents with   . 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      Past Medical History:   Diagnosis Date   . Benign paroxysmal positional vertigo    . Generalized anxiety disorder      Past Surgical History:   Procedure Laterality Date   . HX TUBAL LIGATION       Bactrim [sulfamethoxazole-trimethoprim]    Current Outpatient Medications:   .  ELIQUIS 5 mg Tablet tablet, , Disp: , Rfl:   .  pregabalin (LYRICA) 100 mg Capsule, , Disp: , Rfl:   .  HYDROcodone-acetaminophen (NORCO) 5-325 mg Tablet, take 1 tablet every 6 (six) hours as needed by mouth ., Disp: , Rfl:   .  acetaminophen (TYLENOL) 325 mg Tablet, take 325 mg by mouth ., Disp: , Rfl:   .  sertraline (ZOLOFT) 100 mg Tablet, take 1 tablet every day by mouth ., Disp: 90 tablet, Rfl: 3  .  tiZANidine (ZANAFLEX) 4 mg Tablet, take 1 tablet every 6 (six) hours as needed by mouth  for Muscle spasms. (Patient not taking: Reported on 10/21/2023.), Disp: 30 tablet, Rfl: 0  Social History     Socioeconomic History   . Marital status: Married     Spouse name: Not on file   . Number of children: Not on file   . Years of education: Not on file   . Highest education level: Not on file   Occupational History   . Not on file   Tobacco Use   . Smoking status: Never   . Smokeless tobacco: Never   Vaping Use   . Vaping status: Never Used   Substance and Sexual Activity   . Alcohol use: Never   . Drug use: Never   . Sexual activity: Yes     Partners: Male   Other Topics Concern   . Not on file   Social History Narrative   . Not on file     Social  Determinants of Health     Financial Resource Strain: Not on file   Food Insecurity: Not on file   Transportation Needs: Low Risk  (06/28/2023)    Received from [facility]    Transportation Needs    . Has lack of transportation kept you from medical appointments, meetings, work, or from getting things needed for daily living? : No   Physical Activity: Not on file   Stress: Not on file   Social Connections: Low Risk  (06/28/2023)    Received from [facility]    Social Connections    . How often do you see or talk to people that you care about and feel close to? (For example: talking to friends on the phone, visiting friends or family, going to church or club meetings): 5 or more times a week   Intimate Partner Violence: Not on file  Housing Stability: Not on file     Family History   Problem Relation Name Age of Onset   . Ovarian Cancer Mother     . Diabetes Father     . Hypertension Father         ROS: Complete 14 point ROS was obtained with positives in HPI, otherwise ALL OTHER SYSTEMS negative.    There were no vitals taken for this visit.    Physical Exam:  General: Alert and oriented x3, no acute distress.   HEENT: Pupils equal, round and reactive bilaterally, no scleral injection or redness  Cardiovascular: Regular rate and rhythm, no carotid bruits, radial pulses present  Respiratory: Lungs clear to auscultation bilaterally  GI: Soft, non-tender, non-distended, no organomegaly  Musculoskeletal:    Cervical spine: No midline tenderness to palpation, range of motion normal   LUE: No deformities. Range of motion normal.   RUE: No deformities. Range of motion normal.  Skin: No discoloration. Skin turgor normal.  Psychiatric: Mood and affect normal. Demonstrates understanding of situation.    Neurologic  UE Motor: Strength is symmetric and full in the deltoids, biceps, brachiolradialis, triceps, extensor carpi radialis, extensor carpi ulnaris, intrinsics  LE Motor:  Strength is symmetric and full in the iliopsoas, quadriceps, tibialis anterior, extensor hallucislongus, gastrocnemius and hamstrings.  Muscle Examination: Muscle bulk and tone are normal.  I see no fasciculations or atrophy.  Gait and Station: Walks without limp, list or pelvic tilt.  Sensation: Sensation is symmetric to touch and pinprick in the upper and lower extremities bilaterally.   Reflexes:    UE: symmetric   LE: symmetric   Pathologic: No Hoffman's, Clonus or Babinski's    My independent review of imaging studies demonstrates: MR Cspine 07/28/23 shows mild cervical spondylosis left side HNP at C6-7  The radiological images were reviewed with the patient    Assessment/Plan: C7 radic with correlating imaging and several month hx with significant amount of conseravtive measures.  Surgery could be considered - we discussed foraminotomies, ACDF and arthroplasty.  We also discussed ESI.  She would like to proceed with surgery, which is reasonable.  Will setup for C6-7 arthroplasty.  Will also order ESI.  Should her pain significantly improve, can always cancel surgery.  Risks/benefits discussed at length  I have discussed the risks, benefits and alternatives according to my normal protocol, the patient verbalizes an understanding and wishes to proceed.

## 2023-10-22 ENCOUNTER — Other Ambulatory Visit (INDEPENDENT_AMBULATORY_CARE_PROVIDER_SITE_OTHER): Payer: Self-pay | Admitting: NURSE PRACTITIONER

## 2023-10-22 ENCOUNTER — Other Ambulatory Visit: Payer: Self-pay

## 2023-10-22 ENCOUNTER — Ambulatory Visit
Admission: RE | Admit: 2023-10-22 | Discharge: 2023-10-22 | Disposition: A | Discharge status: Home / Self Care | Source: Ambulatory Visit | Attending: NURSE PRACTITIONER | Admitting: NURSE PRACTITIONER

## 2023-10-22 DIAGNOSIS — I824Z2 Acute embolism and thrombosis of unspecified deep veins of left distal lower extremity: Secondary | ICD-10-CM

## 2023-10-22 DIAGNOSIS — I2699 Other pulmonary embolism without acute cor pulmonale: Secondary | ICD-10-CM

## 2023-10-22 DIAGNOSIS — I8290 Acute embolism and thrombosis of unspecified vein: Secondary | ICD-10-CM | POA: Insufficient documentation

## 2023-10-26 LAB — FACTOR 2 (GENE MUTATION): PROTHROMBIN GENE MUTATION PCR: NEGATIVE

## 2023-10-26 LAB — FACTOR 5 MUTATION (LEIDEN): FACTOR V GENE MUTATION PCR: NEGATIVE

## 2023-11-03 ENCOUNTER — Other Ambulatory Visit: Payer: Self-pay

## 2023-11-03 ENCOUNTER — Ambulatory Visit
Admission: RE | Admit: 2023-11-03 | Discharge: 2023-11-03 | Disposition: A | Discharge status: Home / Self Care | Source: Ambulatory Visit | Attending: NURSE PRACTITIONER | Admitting: NURSE PRACTITIONER

## 2023-11-03 DIAGNOSIS — I8290 Acute embolism and thrombosis of unspecified vein: Secondary | ICD-10-CM | POA: Insufficient documentation

## 2023-11-04 MED ORDER — ONDANSETRON HCL (PF) 4 MG/2 ML INJECTION SOLUTION
INTRAMUSCULAR | Status: AC
Start: 2023-11-04 — End: 2023-11-04
  Filled 2023-11-04: qty 2

## 2023-11-04 MED ORDER — PROPOFOL 10 MG/ML INTRAVENOUS EMULSION
INTRAVENOUS | Status: AC
Start: 2023-11-04 — End: 2023-11-04
  Filled 2023-11-04: qty 20

## 2023-11-05 ENCOUNTER — Ambulatory Visit: Attending: NURSE PRACTITIONER | Admitting: NURSE PRACTITIONER

## 2023-11-05 ENCOUNTER — Other Ambulatory Visit: Payer: Self-pay

## 2023-11-05 ENCOUNTER — Ambulatory Visit (INDEPENDENT_AMBULATORY_CARE_PROVIDER_SITE_OTHER)
Admission: RE | Admit: 2023-11-05 | Discharge: 2023-11-05 | Disposition: A | Discharge status: Home / Self Care | Source: Ambulatory Visit | Attending: NURSE PRACTITIONER | Admitting: NURSE PRACTITIONER

## 2023-11-05 ENCOUNTER — Encounter (INDEPENDENT_AMBULATORY_CARE_PROVIDER_SITE_OTHER): Payer: Self-pay | Admitting: NURSE PRACTITIONER

## 2023-11-05 VITALS — BP 114/61 | HR 65 | Temp 97.7°F | Ht 65.0 in | Wt 175.2 lb

## 2023-11-05 DIAGNOSIS — I82412 Acute embolism and thrombosis of left femoral vein: Secondary | ICD-10-CM | POA: Insufficient documentation

## 2023-11-05 DIAGNOSIS — I824Z2 Acute embolism and thrombosis of unspecified deep veins of left distal lower extremity: Secondary | ICD-10-CM

## 2023-11-05 DIAGNOSIS — D6859 Other primary thrombophilia: Secondary | ICD-10-CM | POA: Insufficient documentation

## 2023-11-05 DIAGNOSIS — Z7901 Long term (current) use of anticoagulants: Secondary | ICD-10-CM | POA: Insufficient documentation

## 2023-11-05 DIAGNOSIS — I2699 Other pulmonary embolism without acute cor pulmonale: Secondary | ICD-10-CM | POA: Insufficient documentation

## 2023-11-05 MED ORDER — ELIQUIS 5 MG TABLET
5.0000 mg | ORAL_TABLET | Freq: Two times a day (BID) | ORAL | 0 refills | Status: AC
Start: 2023-11-05 — End: 2024-02-03

## 2023-11-05 NOTE — Cancer Center Note (Signed)
HEMATOLOGY/ONCOLOGY, [facility],   [address]  [place], [place] [address]       Return Patient Visit     Name: Terri Bautista MRN:  [id number]   Date: 11/05/2023 Age: 53 y.o.  DOB: 1971/03/21       Referring Physician: [facility]  Primary Care Provider: [facility]    Reason for visit/consultation:   Blood Clot in Leg        History of Present Illness:  The patient is a 53 year old female that has been referred to day for the evaluation and management of hypercoagulable state.  Review of medical records indicate the patient presented to [facility] ER on 06/27/2023 for low back pain that radiate down her left leg, and left and lower left chest pain.  A peripheral venous duplex confirmed a left lower extremity deep vein thrombosis in the left common femoral vein, femoral vein, popliteal vein, peroneal vein and posterior tibial vein.  Further diagnostic studies of a CT of the chest showed a right lower lobe pulmonary embolism involving the segmental branch.  The VTE appear to be unprovoked as the patient denies any recent travel, smoking, prolonged immobility or recent illness, OCP or hormone use, no known trauma or no known family history of blood clots.  The patient was subsequently admitted on heparin drip transitioned to Eliquis at 10 mg b.i.d. x7 days then decreased dosage to 5 mg b.i.d, which the patient has been on since 07/08/2023.  She was seen by hematology oncology Sharion Settler FNP during her hospitalization.  The plan at time of discharge was anticoagulation therapy for 3 months with repeat venous Doppler of lower extremities before stopping Eliquis.    Past Medical History  Past Medical History:   Diagnosis Date    Depression     DVT (deep venous thrombosis) (CMS HCC)       Past Surgical History:   Procedure Laterality Date    HX TONSILLECTOMY      HX TUBAL LIGATION        Family Medical History:    None         Social  History  Occupation:   Social History     Occupational History    Not on file    Hometown: [place] [address]  Social History     Socioeconomic History    Marital status: Married   Tobacco Use    Smoking status: Former     Types: Cigarettes    Smokeless tobacco: Never   Vaping Use    Vaping status: Never Used   Substance and Sexual Activity    Alcohol use: Not Currently    Drug use: Never     Social Determinants of Health     Transportation Needs: Low Risk  (06/28/2023)    Transportation Needs     SDOH Transportation: No   Social Connections: Low Risk  (06/28/2023)    Social Connections     SDOH Social Isolation: 5 or more times a week       Allergies   Allergen Reactions    Bactrim [Sulfamethoxazole-Trimethoprim] Rash    Cephalexin      Current Outpatient Medications   Medication Sig    acetaminophen (TYLENOL) 325 mg Oral Tablet Take 1 Tablet (325 mg total) by mouth Every 4 hours as needed for Pain    ELIQUIS 5 mg Oral Tablet Take 1 Tablet (5 mg total) by mouth Twice daily for 90  days    HYDROcodone-acetaminophen (NORCO) 5-325 mg Oral Tablet Take 1 Tablet by mouth Every 6 hours as needed for up to 12 doses    pregabalin (LYRICA) 100 mg Oral Capsule     sertraline (ZOLOFT) 100 mg Oral Tablet Take 1 Tablet (100 mg total) by mouth Once a day           ROS:   Review of Systems   Constitutional: Negative.    HENT:  Negative.     Eyes: Negative.    Respiratory: Negative.     Cardiovascular:  Positive for leg swelling (LEft leg).   Gastrointestinal: Negative.    Endocrine: Negative.    Genitourinary:  Positive for difficulty urinating.    Musculoskeletal:         Left groin pain   Skin: Negative.    Neurological: Negative.    Hematological: Negative.    Psychiatric/Behavioral: Negative.     All other systems reviewed and are negative.      Physical Examination:  BP 114/61 (Site: Right Arm, Patient Position: Sitting, Cuff Size: Adult)   Pulse 65   Temp 36.5 C (97.7 F) (Temporal)   Ht 1.651 m (5\' 5" )   Wt 79.5 kg (175  lb 3.2 oz)   SpO2 96%   BMI 29.15 kg/m       Physical Exam  Vitals reviewed.   HENT:      Head: Normocephalic.      Nose: Nose normal.   Neurological:      Mental Status: She is alert and oriented to person, place, and time.   Psychiatric:         Mood and Affect: Mood normal.         Behavior: Behavior normal.         Thought Content: Thought content normal.         Judgment: Judgment normal.        ECOG Status: 0 - Fully active, able to carry on all pre-disease performance without restriction.       Labs:   CBC  Diff   Lab Results   Component Value Date/Time    WBC 8.3 07/02/2023 05:22 AM    HGB 11.2 07/02/2023 05:22 AM    HCT 32.8 07/02/2023 05:22 AM    PLTCNT 340 07/02/2023 05:22 AM    RBC 3.84 07/02/2023 05:22 AM    MCV 85.3 07/02/2023 05:22 AM    MCHC 34.2 07/02/2023 05:22 AM    MCH 29.2 07/02/2023 05:22 AM    RDW 13.3 07/02/2023 05:22 AM    MPV 7.0 (L) 07/02/2023 05:22 AM    Lab Results   Component Value Date/Time    PMNS 66 07/02/2023 05:22 AM    LYMPHOCYTES 20 07/02/2023 05:22 AM    EOSINOPHIL 4 07/02/2023 05:22 AM    MONOCYTES 10 07/02/2023 05:22 AM    BASOPHILS 1 07/02/2023 05:22 AM    BASOPHILS 0.10 07/02/2023 05:22 AM    PMNABS 5.50 07/02/2023 05:22 AM    LYMPHSABS 1.70 07/02/2023 05:22 AM    EOSABS 0.30 07/02/2023 05:22 AM    MONOSABS 0.80 07/02/2023 05:22 AM            Comprehensive Metabolic Profile    Lab Results   Component Value Date    SODIUM 138 07/02/2023    POTASSIUM 4.3 07/02/2023    CHLORIDE 103 07/02/2023    CO2 27 07/02/2023    ANIONGAP 8 07/02/2023    BUN 9 07/02/2023  CREATININE 0.72 07/02/2023    ALBUMIN 3.5 07/02/2023    CALCIUM 8.8 07/02/2023    GLUCOSENF 109 07/02/2023    ALKPHOS 119 (H) 07/02/2023    ALT 37 07/02/2023    AST 17 07/02/2023    TOTBILIRUBIN 0.8 07/02/2023    TOTALPROTEIN 6.9 07/02/2023     06/30/23 Beta-2 Glycoprotein  B2GP1 IGG QUANTITATIVE  <=19 U/mL <1.4   B2GP1 IGM QUANTITATIVE  <=19 U/mL <1.5   B2GP1 IGM-QUALITATIVE  Negative Negative   B2GP1  IGG-QUALITATIVE  Negative Negative     CARDIOLIPIN IGG QUANTITATIVE  <=19 U/mL <1.6   CARDIOLIPIN IGG QUALITATIVE  Negative Negative   Comment: Autoimmune serology results must be interpreted within the clinical context.    Test performed on a [employer] BioPlex analyzer using multiplex immunoassay reagents according to manufacturer instructions.     CARDIOLIPIN IGM QUANTITATIVE  <=19 U/mL <1.5   CARDIOLIPIN IGM QUALITATIVE  Negative Negative   Comment: Autoimmune serology results must be interpreted within the clinical context.    Test performed on a [employer] BioPlex analyzer using multiplex immunoassay  reagents according to manufacturer instructions.     Specimen Collected: 06/30/23 05:27           10/22/2023 Factor V and Factor II negative     Pathology:  None to Review today     Radiology:    11/03/2023 Venous duplex lower extremity-  IMPRESSION:  There is some residual thrombus left common femoral vein which is partially compressible. No DVT is seen distal to the left common femoral vein.    06/27/23 Venous duplex lower extremity-   There is deep venous thrombosis seen within the left common femoral vein, femoral vein, popliteal vein, peroneal vein, and posterior tibial vein. There is normal flow within the left anterior tibial vein. The right common femoral vein showed flow.     06/28/23 CT Chest-  There is a pulmonary embolism in the right lower lobe branch involving a subsegmental branch.  Most Proximal Level Of Embolus (if embolus present): Subsegmental    06/29/23 Strain Echo- Normal Left ventricular size, Left ventricular systolic function is normal, no wall motion abnormalities present. Normal Right ventricular size, Right ventricular function is normal.  No significant valvular heart disease.      Assessment/Plan:     Problem List Items Addressed This Visit          Cardiovascular System    Acute deep vein thrombosis of lower leg, left (CMS HCC) - Primary    Relevant Medications    ELIQUIS 5 mg Oral Tablet        Oncology History    No history exists.      LLE VTE with PE unprovoked  Factor V and Factor II Negative   Long term anticoagulation therapy does not appear to be required at this time. Patient was advised to continue to use compression stocking. Increase mobility. Avoid over-the-counter supplements containing vitamin K.  Repeat Venous Duplex continues to show residual clot burden    PLAN:  All relative external and internal medical records were reviewed including available H&Ps, progress notes, procedure notes, imaging's, laboratories, and pathology   Left lower extremity VTE with PE- Unprovoked  With remaining LLE clot burden continuation coagulation therapy 3 months to equal total of 6 months   Continue on Eliquis 5mg  bid, RX provided for an additional 3 months then safely discontinue anticoagulation therapy   Advised not to have any elective surgeries for 6 months after an acute VTE  The patient was given the opportunity to ask questions, and these were answered to their satisfaction. Pasty Manninen  is welcome to call with any questions or concerns at any point.        Return if symptoms worsen or fail to improve.    On the day of the encounter, I spent a total of 32 minutes on this patient encounter including review of historical information, examination, documentation and post-visit activities.       Evie Lacks APRN, FNP-C 1/24/202510:53    You can see your note(s) in [email or web address]. It is common for you to encounter certain medical terminology which may be unfamiliar to you. You might see results before your provider does so please give at least 2 business days for review. Please have this understanding, that NOT all abnormal results are significant. Our office will contact you for any urgent or emergent action if necessary. If you have any questions or concerns, feel free to send a MyChart message or call the office. Please call with any new or concerning symptoms.       CC:  [facility]  [address]  [place] [address]    No primary care provider on file.    Portions of this note may be dictated using voice recognition software or a dictation service. Variances in spelling and vocabulary are possible and unintentional. Not all errors are caught/corrected. Please notify the Thereasa Parkin if any discrepancies are noted or if the meaning of any statement is not clear.

## 2023-11-19 ENCOUNTER — Other Ambulatory Visit: Attending: Physician Assistant | Admitting: Physician Assistant

## 2023-11-19 ENCOUNTER — Ambulatory Visit (INDEPENDENT_AMBULATORY_CARE_PROVIDER_SITE_OTHER): Admitting: Physician Assistant

## 2023-11-19 ENCOUNTER — Encounter (INDEPENDENT_AMBULATORY_CARE_PROVIDER_SITE_OTHER): Payer: Self-pay | Admitting: Physician Assistant

## 2023-11-19 ENCOUNTER — Other Ambulatory Visit: Payer: Self-pay

## 2023-11-19 VITALS — BP 107/60 | HR 68 | Ht 65.0 in | Wt 174.6 lb

## 2023-11-19 DIAGNOSIS — N390 Urinary tract infection, site not specified: Secondary | ICD-10-CM

## 2023-11-19 DIAGNOSIS — R319 Hematuria, unspecified: Secondary | ICD-10-CM | POA: Insufficient documentation

## 2023-11-19 DIAGNOSIS — N393 Stress incontinence (female) (male): Secondary | ICD-10-CM

## 2023-11-19 DIAGNOSIS — Z87440 Personal history of urinary (tract) infections: Secondary | ICD-10-CM

## 2023-11-19 DIAGNOSIS — R3915 Urgency of urination: Secondary | ICD-10-CM

## 2023-11-19 DIAGNOSIS — R3914 Feeling of incomplete bladder emptying: Secondary | ICD-10-CM

## 2023-11-19 DIAGNOSIS — R3911 Hesitancy of micturition: Secondary | ICD-10-CM

## 2023-11-19 DIAGNOSIS — R350 Frequency of micturition: Secondary | ICD-10-CM

## 2023-11-19 LAB — URINALYSIS, MICROSCOPIC
BACTERIA: NEGATIVE /[HPF]
RBCS: <1 /[HPF] (ref <4)
SQUAMOUS EPITHELIAL: <1 /[HPF] (ref <28)
WBCS: <1 /[HPF] (ref <6)

## 2023-11-19 NOTE — Progress Notes (Signed)
UROLOGY, [facility]  [address] [address]    Progress Note    Name: Terri Bautista MRN:  [id number]   Date: 11/19/2023 DOB:  [birth year] (53 y.o.)             Chief Complaint: Follow Up 3 Months, Recurrent Urinary Tract Infections, Stress Urinary Incontinence, and Urinary Retention  Subjective   Subjective:   Terri Bautista is a pleasant 53 y.o. White female whom presents to the clinic for sensation of difficulty urinating and recent UTI. Patient with urinary hesitancy with sensation of need to push to urinate. Patient with UTI during recent admission for deep vein thrombosis with PE.  Patient unsure if she was symptomatic at this time.  Patient denies any urinary tract infection symptoms since discharge such as urinary urgency, frequency, dysuria.  PVR 113cc. Patient with urinary incontinence, mild with laugh, cough, sneeze.     Patient denies personal or family history of urinary malignancy. Patient denies occupational chemical exposure. Patient history of  social, remote tobacco use. Patient denies fevers, chills, nausea, vomiting, hematuria, dysuria, flank pain,  dribbling,suprapubic pain, headaches, vision changes, shortness of breath, chest pain.        Today, patient reports continued sensation of incomplete bladder emptying and urinary urgency and frequency. PVR 5cc. No UTI in interval. Patient denies fevers, chills, nausea, vomiting, hematuria, dysuria, flank pain, dribbling, hesitancy, suprapubic pain, headaches, vision changes, shortness of breath, chest pain.       Objective   Objective :  BP 107/60 (Site: Right Arm, Patient Position: Sitting, Cuff Size: Adult)   Pulse 68   Ht 1.651 m (5\' 5" )   Wt 79.2 kg (174 lb 9.6 oz)   BMI 29.05 kg/m       Gen: NAD, alert  Pulm: unlabored at rest  CV: palpable pulses  Abd: soft, Nt/ND  GU: no suprapubic tenderness, no CVAT    Data reviewed:    Current Outpatient Medications   Medication Sig    acetaminophen (TYLENOL) 325 mg Oral Tablet  Take 1 Tablet (325 mg total) by mouth Every 4 hours as needed for Pain    ELIQUIS 5 mg Oral Tablet Take 1 Tablet (5 mg total) by mouth Twice daily for 90 days    HYDROcodone-acetaminophen (NORCO) 5-325 mg Oral Tablet Take 1 Tablet by mouth Every 6 hours as needed for up to 12 doses    pregabalin (LYRICA) 100 mg Oral Capsule     sertraline (ZOLOFT) 100 mg Oral Tablet Take 1 Tablet (100 mg total) by mouth Once a day        Assessment/Plan  Problem List Items Addressed This Visit    None  Visit Diagnoses       Urinary hesitancy    -  Primary    Recurrent UTI        SUI (stress urinary incontinence, female)                Recurrent urinary tract infection  I discussed the epidemiology, pathogenesis, and prevention of recurrent acute uncomplicated cystitis and pyelonephritis of recurrent urinary tract infections with patient  We discussed that, while bothersome, there is currently no evidence linking recurrent infections to significant health problems, such as hypertension or renal disease, in the absence of anatomic or functional abnormalities of the urinary tract.  We spent the majority of our time discussing various prevention strategies, including:  Behavioral modifications:  If sexually active, avoidance of spermicides  While controlled  studies have not shown post-coital voiding and liberal fluid intake to be associated with a reduced risk of recurrent UTI, these measures are unlikely to be harmful  Cranberry compounds:  While there is likely little harmful effect other than an increase in calorie and glucose intake with cranberry juice, there remains a limited proven role; however, consideration may be given to a well-studied cranberry pill supplements such as TheraCran HP (Theralogix) which has a standardized formula and has been shown in scientific studies to promote urinary tract health  Antimicrobial prophylaxis options (continuous, postcoital, or self-start therapy) were also discussed for women who  experience two or more symptomatic UTIs within six months or three or more over 12 months  We discussed balancing the degree of discomfort experienced from these infections with real concerns about antimicrobial resistance  The prophylactic efficacy of any antimicrobial agent depends upon the continued susceptibility to the drug of potential uropathogens colonizing patient's fecal, periurethral, and vaginal flora and must be weighed against emerging uropathogen resistance patterns  Topical estrogen for postmenopausal women as a means to increase the prevalence of lactobacilli and decrease in E. coli vaginal colonization  We also discussed other potential, albeit unproven strategies to potentially reduce risk of UTI recurrence including:  Probiotics  Urinary antiseptics including methenamine salts (HiprexT, UrexT)  While generally of a low diagnostic yield, would recommend further work-up in the future including RBUS, CT scan, and cystoscopy    Sensation of urinary retention with suspected OAB; PVR of 5 cc  Extensively discussed pathophysiology, nature, differential diagnosis of sensation of urinary retention.    I discussed the differential diagnosis, pathophysiology and nature of patient's overactive bladder/urge urinary incontinence  I also counseled patient on conservative management options including appropriate fluid management, avoidance of diuretics including caffeine and alcohol, weight loss (if applicable), dedicated pelvic floor muscle therapy and Kegel's exercises  Additionally, we discussed the role of pharmacotherapy, including risks, benefits and alternatives:  Anticholinergic therapy (e.g. Oxybutynin, Tolterodine, Solifenacin, etc)- discussed potential risks of dry mouth, dry eyes, constipation, impaired cognition, prolonged Q-T interval and urinary retention  Beta-3 agonist therapy (e.g. Mirabegron) - discussed potential risks of hypertension, nasopharyngitis, urinary tract infection and  headache  Sample gemtesa 75 mg daily x 2 months given, follow up in 3 months.   PVR at each visit    SUI  We discussed in detail the nature and pathophysiology of bothersome stress urinary incontinence.  Given the bothersome nature of her symptoms, we discussed potential treatment options including:  Conservative management, including pelvic floor "Kegel" exercises, which is generally reserved for mild cases  Estrogen replacement therapy either primarily or as an adjunct, when not contraindicated, to restore blood flow to the female pelvic organs to aid in preserving vascularity to her pelvic floor  Urethral bulking agents  Synthetic midurethral sling delivered either a retropubic or transobturator approach  Bladder neck sling utilizing autologous fascia  We also discussed the incidence, nature and risks of de novo urge urinary incontinence following treatment of stress urinary incontinence, particularly following midurethral sling      Barry Culverhouse, PA-C

## 2024-02-18 ENCOUNTER — Encounter (INDEPENDENT_AMBULATORY_CARE_PROVIDER_SITE_OTHER): Payer: Self-pay | Admitting: Physician Assistant

## 2024-05-20 ENCOUNTER — Encounter (HOSPITAL_BASED_OUTPATIENT_CLINIC_OR_DEPARTMENT_OTHER): Payer: Self-pay

## 2024-05-20 ENCOUNTER — Emergency Department
Admission: EM | Admit: 2024-05-20 | Discharge: 2024-05-20 | Disposition: A | Discharge status: Home / Self Care | Attending: FAMILY PRACTICE | Admitting: FAMILY PRACTICE

## 2024-05-20 ENCOUNTER — Emergency Department (HOSPITAL_BASED_OUTPATIENT_CLINIC_OR_DEPARTMENT_OTHER)

## 2024-05-20 ENCOUNTER — Other Ambulatory Visit: Payer: Self-pay

## 2024-05-20 DIAGNOSIS — R0789 Other chest pain: Secondary | ICD-10-CM | POA: Insufficient documentation

## 2024-05-20 DIAGNOSIS — Z86711 Personal history of pulmonary embolism: Secondary | ICD-10-CM | POA: Insufficient documentation

## 2024-05-20 DIAGNOSIS — R079 Chest pain, unspecified: Secondary | ICD-10-CM

## 2024-05-20 DIAGNOSIS — Z86718 Personal history of other venous thrombosis and embolism: Secondary | ICD-10-CM | POA: Insufficient documentation

## 2024-05-20 DIAGNOSIS — M5030 Other cervical disc degeneration, unspecified cervical region: Secondary | ICD-10-CM | POA: Insufficient documentation

## 2024-05-20 DIAGNOSIS — R42 Dizziness and giddiness: Secondary | ICD-10-CM

## 2024-05-20 LAB — CBC WITH DIFF
BASOPHIL #: 0.02 x10ˆ3/uL (ref 0.00–0.10)
BASOPHIL %: 0 % (ref 0–1)
EOSINOPHIL #: 0.16 x10ˆ3/uL (ref 0.00–0.50)
EOSINOPHIL %: 2 % (ref 1–7)
HCT: 40.4 % (ref 31.2–41.9)
HGB: 13.4 g/dL (ref 10.9–14.3)
LYMPHOCYTE #: 2.17 x10ˆ3/uL (ref 1.10–3.10)
LYMPHOCYTE %: 33 % (ref 16–46)
MCH: 28.4 pg (ref 24.7–32.8)
MCHC: 33.2 g/dL (ref 32.3–35.6)
MCV: 85.5 fL (ref 75.5–95.3)
MONOCYTE #: 0.5 x10ˆ3/uL (ref 0.20–0.90)
MONOCYTE %: 7 % (ref 4–11)
MPV: 7.4 fL — ABNORMAL LOW (ref 7.9–10.8)
NEUTROPHIL #: 3.82 x10ˆ3/uL (ref 1.90–8.20)
NEUTROPHIL %: 57 % (ref 43–77)
PLATELETS: 331 x10ˆ3/uL (ref 140–440)
RBC: 4.73 x10ˆ6/uL (ref 3.63–4.92)
RDW: 15.2 % (ref 12.3–17.7)
WBC: 6.7 x10ˆ3/uL (ref 3.8–11.8)

## 2024-05-20 LAB — COMPREHENSIVE METABOLIC PANEL, NON-FASTING
ALBUMIN/GLOBULIN RATIO: 1 (ref 0.8–1.4)
ALBUMIN: 3.9 g/dL (ref 3.4–5.0)
ALKALINE PHOSPHATASE: 106 U/L (ref 46–116)
ALT (SGPT): 57 U/L (ref <=78)
ANION GAP: 11 mmol/L (ref 4–13)
AST (SGOT): 25 U/L (ref 15–37)
BILIRUBIN TOTAL: 0.6 mg/dL (ref 0.2–1.0)
BUN/CREA RATIO: 15
BUN: 13 mg/dL (ref 7–18)
CALCIUM, CORRECTED: 9.3 mg/dL
CALCIUM: 9.2 mg/dL (ref 8.5–10.1)
CHLORIDE: 105 mmol/L (ref 98–107)
CO2 TOTAL: 25 mmol/L (ref 21–32)
CREATININE: 0.86 mg/dL (ref 0.55–1.02)
ESTIMATED GFR: 80 mL/min/1.73mˆ2 (ref >59)
GLOBULIN: 4
GLUCOSE: 91 mg/dL (ref 74–106)
OSMOLALITY, CALCULATED: 281 mosm/kg (ref 270–290)
POTASSIUM: 3.6 mmol/L (ref 3.5–5.1)
PROTEIN TOTAL: 7.9 g/dL (ref 6.4–8.2)
SODIUM: 141 mmol/L (ref 136–145)

## 2024-05-20 LAB — URINALYSIS, MACRO/MICRO
BILIRUBIN: NEGATIVE mg/dL
BLOOD: NEGATIVE mg/dL
GLUCOSE: NEGATIVE mg/dL
KETONES: NEGATIVE mg/dL
LEUKOCYTES: NEGATIVE WBCs/uL
NITRITE: NEGATIVE
PH: 7.5 (ref 4.6–8.0)
PROTEIN: NEGATIVE mg/dL
SPECIFIC GRAVITY: 1.01 (ref 1.003–1.035)
UROBILINOGEN: 1 mg/dL (ref 0.2–1.0)

## 2024-05-20 LAB — TROPONIN-I
TROPONIN I: 3 ng/L (ref <15)
TROPONIN I: 3 ng/L (ref <15)
TROPONIN I: 3 ng/L (ref <15)

## 2024-05-20 LAB — PTT (PARTIAL THROMBOPLASTIN TIME): APTT: 29.6 s (ref 25.0–38.0)

## 2024-05-20 LAB — PT/INR
INR: 0.97 (ref 0.84–1.10)
PROTHROMBIN TIME: 11 s (ref 9.8–12.7)

## 2024-05-20 LAB — MAGNESIUM: MAGNESIUM: 2.1 mg/dL (ref 1.8–2.4)

## 2024-05-20 LAB — D-DIMER: D-DIMER: 233 ng{FEU}/mL (ref 215–500)

## 2024-05-20 MED ORDER — ASPIRIN 81 MG CHEWABLE TABLET
324.0000 mg | CHEWABLE_TABLET | ORAL | Status: AC
Start: 2024-05-20 — End: 2024-05-20
  Administered 2024-05-20: 324 mg via ORAL

## 2024-05-20 MED ORDER — ASPIRIN 81 MG CHEWABLE TABLET
CHEWABLE_TABLET | ORAL | Status: AC
Start: 2024-05-20 — End: 2024-05-20
  Filled 2024-05-20: qty 4

## 2024-05-20 NOTE — ED Nurses Note (Signed)
 Patient discharged home with family.  AVS reviewed with patient/care giver.  A written copy of the AVS and discharge instructions was given to the patient/care giver. Scripts escribed to preferred pharmacy. Questions sufficiently answered as needed.  Patient/care giver encouraged to follow up with PCP as indicated.  In the event of an emergency, patient/care giver instructed to call 911 or go to the nearest emergency room.

## 2024-05-20 NOTE — ED Nurses Note (Signed)
Dr. Riki Sheer in to discuss DC planning

## 2024-05-20 NOTE — ED Nurses Note (Signed)
 Orthostatic VS complete. Dizziness upon position change. States, I get a little dizzy when I move positions. Chest pain decreased 3/10. Cardiac monitor shows SR without ectopy. Respirations even and unlabored. 02 sat 95% room air.

## 2024-05-20 NOTE — ED Provider Notes (Signed)
 [facility], [place] - Emergency Department  ED Primary Note  History of Present Illness   Terri Bautista is a 53 y.o. female who had concerns including Chest Pain .  This 53 year old female patient presents emergency department with chest pain that began about 20-30 minutes prior to arrival and was located in the left upper anterior chest noted and has a tightness sensation she felt lightheaded at a hot sensation as well.  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  She would have an extensive workup for coagulopathy which was negative and she went off of her apixaban  in March or April.  She had some mild associated dyspnea and diaphoresis but no tachycardia palpitations or radiation of pain into the neck arm or jaw.    Past medical history: Depression, deep vein thrombosis, pulmonary embolus.  Past surgical history: Tonsillectomy, tubal ligation   Social history: Former smoker has a never vaped, use marijuana or illicit drugs.  No current alcohol use.    Physical Exam   ED Triage Vitals   BP (Non-Invasive) 05/20/24 1045 105/79   Heart Rate 05/20/24 1050 77   Respiratory Rate 05/20/24 1050 16   Temperature 05/20/24 1050 36.4 C (97.6 F)   SpO2 05/20/24 1050 94 %   Weight 05/20/24 1050 72.6 kg (160 lb)   Height 05/20/24 1050 1.651 m (5' 5)     Physical Exam  General: No acute distress, nontoxic in appearance but she is anxious.    Neck: Supple without JVD or carotid bruits.  No adenopathy.    Lungs: Clear symmetrical with good aeration   Heart: Regular rate rhythm without murmur   Abdomen: Soft, normal bowel sounds non-tender.   Extremities:  Moving symmetrically   Skin: No suspicious rashes, lesions, edema.  Neurological: No focal deficits   Psychiatric: Mildly anxious    Patient Data   Labs Ordered/Reviewed   CBC WITH DIFF - Abnormal; Notable for the following components:       Result Value    MPV 7.4 (*)     All other  components within normal limits   PT/INR - Normal    Narrative:     In the setting of warfarin therapy, a moderate-intensity INR goal range is 2.0 to 3.0 and a high-intensity INR goal range is 2.5 to 3.5.    INR is ONLY validated to determine the level of anticoagulation with vitamin K antagonists (warfarin). Other factors may elevate the INR including but not limited to direct oral anticoagulants (DOACs), liver dysfunction, vitamin K deficiency, DIC, factor deficiencies, and factor inhibitors.   PTT (PARTIAL THROMBOPLASTIN TIME) - Normal   TROPONIN-I - Normal    Narrative:     Values received on females ranging between 12-15 ng/L MUST include the next serial troponin to review changes in the delta differences as the reference range for the Access II chemistry analyzer is lower than the established reference range.     TROPONIN-I - Normal    Narrative:     Values received on females ranging between 12-15 ng/L MUST include the next serial troponin to review changes in the delta differences as the reference range for the Access II chemistry analyzer is lower than the established reference range.     TROPONIN-I - Normal    Narrative:     Values received on females ranging between 12-15 ng/L MUST include the next serial troponin to review changes  in the delta differences as the reference range for the Access II chemistry analyzer is lower than the established reference range.     D-DIMER - Normal    Narrative:     D-Dimers are reported in FEU per ng/mL.    IF PATIENT IS EXHIBITING SYMPTOMS DVT/PE, THIS D-DIMER RESULT MAY INDICATE A NEED FOR FURTHER TESTING FOR THESE CONDITIONS. IF PATIENT IS SUSPECTED OF DIC AND SYMPTOMS WORSEN OR PERSIST, A REPEAT DIC WORKUP SHOULD BE CONSIDERED.    NOTE: ALTHOUGH THE NORMAL RANGE FOR THIS TEST IS 215-500 ng/mL FEU, LITERATURE RECOMMENDS FURTHER TESTING FOR ANY RESULT >500ng/mL FEU.    A cut off value of 500ng/mL FEU or below can be used as an aid in the diagnosis of Thromboembolism  when used in conjunction with the patient's medical history, clinical presentation and other findings. Results of 500ng/mL FEU or below have a negative predictive value of 100%.     MAGNESIUM - Normal   URINALYSIS, MACRO/MICRO - Normal   CBC/DIFF    Narrative:     The following orders were created for panel order CBC/DIFF.  Procedure                               Abnormality         Status                     ---------                               -----------         ------                     CBC WITH IPQQ[[id number]]                Abnormal            Final result                 Please view results for these tests on the individual orders.   COMPREHENSIVE METABOLIC PANEL, NON-FASTING    Narrative:     Estimated Glomerular Filtration Rate (eGFR) is calculated using the CKD-EPI (2021) equation, intended for patients 104 years of age and older. If gender is not documented or unknown, there will be no eGFR calculation.   URINALYSIS WITH REFLEX MICROSCOPIC AND CULTURE IF POSITIVE    Narrative:     The following orders were created for panel order URINALYSIS WITH REFLEX MICROSCOPIC AND CULTURE IF POSITIVE.  Procedure                               Abnormality         Status                     ---------                               -----------         ------                     URINALYSIS, MACRO/MICRO[[id number]]      Normal              Final result  Please view results for these tests on the individual orders.     XR AP MOBILE CHEST   Final Result by Edi, [facility] In (08/09 1121)   NO ACUTE FINDINGS.            Radiologist location ID: [id number]           Medical Decision Making        Medical Decision Making  High complexity due to patient's presentation with left upper pressure type chest pain with a differential to include pleurisy, PE, pneumonia, pneumothorax, atypical presentation of acute coronary syndrome, anxiety, muscle strain or cervical disc disease, and she has known disc disease and  C4-5, C 5 6, and C 6 7.  On the left.  Laboratory data was performed, all were sensory normal and troponin was 3 at 0, 1, and 3 hours, totally normal.  EKG with sinus rhythm also normal.  She is a former smoker quitting several years ago no alcohol, marijuana illicit drug use, no family history of heart disease at a young age and she has no history of hypertension, hyperlipidemia or previous coronary events.  Laboratory data reviewed, of the cervical reviewed from September 2024, you, chest x-ray also evaluated.  This appears to be noncardiac chest pain, leaning more toward musculoskeletal and possibly even cervical disease.    Amount and/or Complexity of Data Reviewed  Labs: ordered.     Details: Labs reviewed and noted  Radiology: ordered.     Details: Chest x-ray reviewed noted, CT of the cervical spine done in September 2024 also reviewed and noted  ECG/medicine tests: ordered.     Details: EKG reviewed and noted.    Risk  OTC drugs.      ED Course as of 05/20/24 1520   Sat May 20, 2024   1250 EKG at 10:46 a.m., read 1047: Sinus at 75 with normal intervals and axis            Medications Ordered/Administered in the ED   aspirin  chewable tablet 324 mg (324 mg Oral Given 05/20/24 1136)     Clinical Impression   Atypical chest pain (Primary)   History of pulmonary embolus (PE)   DDD (degenerative disc disease), cervical       Disposition: Discharged

## 2024-05-20 NOTE — Discharge Instructions (Addendum)
 Your chest pain appears to be noncardiac, no significant EKG changes, labs do not indicate evaluation for DVTs or pulmonary embolus.  Three sets of heart enzymes were also totally normal at 3, high normal as 14.  With a history of cervical disc disease and some cervical/spinal stenosis in his C-spine I do recommend that this be evaluated but also recommend a stress test to be a reassurance that this is not a cardiac symptomatology.    Continue home medications as prescribed, call your primary care doctor on Monday for follow up with a in the next 2-3 days if possible.

## 2024-05-20 NOTE — ED Nurses Note (Signed)
 Pt states, My pain is almost gone now. I feel much better. No further changes noted.

## 2024-05-20 NOTE — ED Nurses Note (Signed)
 Pt awake alert . Respirations even and unlabored. 02 sat 95% room air. Cardiac monitor shows SR without ectopy. Pt states, I started having left chest pain, it just felt heavy. I was dizzy and short of breath a little sweaty.  The last time this happened, I had a blood clot in my lung. I just stopped taking my blood thinner several months ago. Pain 5/10 nonradiating

## 2024-05-20 NOTE — ED Nurses Note (Signed)
 Pt awake alert. Respirations even and unlabored. No distress noted . No further c/o's. Husband at bedside. Patient discharged home with family.  AVS reviewed with patient/care giver.  A written copy of the AVS and discharge instructions was given to the patient/care giver.Questions sufficiently answered as needed.  Patient/care giver encouraged to follow up with PCP as indicated.  In the event of an emergency, patient/care giver instructed to call 911 or go to the nearest emergency room.

## 2024-05-22 LAB — ECG 12 LEAD
Atrial Rate: 75 {beats}/min
Calculated P Axis: 22 degrees
Calculated R Axis: 37 degrees
Calculated T Axis: 37 degrees
PR Interval: 142 ms
QRS Duration: 74 ms
QT Interval: 382 ms
QTC Calculation: 426 ms
Ventricular rate: 75 {beats}/min
